# Patient Record
Sex: Female | Born: 1955 | Race: White | Hispanic: No | Marital: Married | Smoking: Former smoker
Health system: Southern US, Community
[De-identification: ages and names within clinical notes are randomized; demographics above are authoritative.]

## PROBLEM LIST (undated history)

## (undated) DIAGNOSIS — G610 Guillain-Barre syndrome: Secondary | ICD-10-CM

## (undated) DIAGNOSIS — C801 Malignant (primary) neoplasm, unspecified: Secondary | ICD-10-CM

## (undated) HISTORY — DX: Guillain-Barre syndrome: G61.0

---

## 1999-05-18 ENCOUNTER — Other Ambulatory Visit: Admission: RE | Admit: 1999-05-18 | Discharge: 1999-05-18 | Payer: Self-pay | Admitting: Obstetrics & Gynecology

## 1999-05-25 ENCOUNTER — Encounter: Payer: Self-pay | Admitting: Obstetrics & Gynecology

## 1999-05-25 ENCOUNTER — Encounter: Admission: RE | Admit: 1999-05-25 | Discharge: 1999-05-25 | Payer: Self-pay | Admitting: Obstetrics & Gynecology

## 1999-07-22 ENCOUNTER — Ambulatory Visit (HOSPITAL_BASED_OUTPATIENT_CLINIC_OR_DEPARTMENT_OTHER): Admission: RE | Admit: 1999-07-22 | Discharge: 1999-07-22 | Payer: Self-pay | Admitting: Surgery

## 1999-08-17 ENCOUNTER — Encounter: Payer: Self-pay | Admitting: Surgery

## 1999-08-19 ENCOUNTER — Ambulatory Visit (HOSPITAL_COMMUNITY): Admission: RE | Admit: 1999-08-19 | Discharge: 1999-08-19 | Payer: Self-pay | Admitting: Surgery

## 1999-08-19 ENCOUNTER — Encounter: Payer: Self-pay | Admitting: Surgery

## 1999-08-29 ENCOUNTER — Encounter: Payer: Self-pay | Admitting: Surgery

## 1999-08-29 ENCOUNTER — Encounter: Admission: RE | Admit: 1999-08-29 | Discharge: 1999-08-29 | Payer: Self-pay | Admitting: Surgery

## 1999-09-06 ENCOUNTER — Encounter: Admission: RE | Admit: 1999-09-06 | Discharge: 1999-12-05 | Payer: Self-pay | Admitting: Radiation Oncology

## 2000-02-21 HISTORY — PX: BREAST LUMPECTOMY: SHX2

## 2010-03-22 ENCOUNTER — Encounter (INDEPENDENT_AMBULATORY_CARE_PROVIDER_SITE_OTHER): Payer: Self-pay | Admitting: *Deleted

## 2010-03-30 NOTE — Letter (Signed)
Summary: Pre Visit Letter Revised  [facility]  [address] [place], [place] [address]   Phone: [phone]  Fax: [phone]        03/22/2010 MRN: [id number] [facility] [place] [address] [place], [place]  [address]             Procedure Date:  04/18/2010 @ 1:30   Direct Colon-Dr. Arlyce Dice   Welcome to the Gastroenterology Division at [facility].    You are scheduled to see a nurse for your pre-procedure visit on 04/04/2010 at 10:00 on the 3rd floor at [facility], [address]  We ask that you try to arrive at our office 15 minutes prior to your appointment time to allow for check-in.  Please take a minute to review the attached form.  If you answer "Yes" to one or more of the questions on the first page, we ask that you call the person listed at your earliest opportunity.  If you answer "No" to all of the questions, please complete the rest of the form and bring it to your appointment.    Your nurse visit will consist of discussing your medical and surgical history, your immediate family medical history, and your medications.   If you are unable to list all of your medications on the form, please bring the medication bottles to your appointment and we will list them.  We will need to be aware of both prescribed and over the counter drugs.  We will need to know exact dosage information as well.    Please be prepared to read and sign documents such as consent forms, a financial agreement, and acknowledgement forms.  If necessary, and with your consent, a friend or relative is welcome to sit-in on the nurse visit with you.  Please bring your insurance card so that we may make a copy of it.  If your insurance requires a referral to see a specialist, please bring your referral form from your primary care physician.  No co-pay is required for this nurse visit.     If you cannot keep your appointment, please call [phone] to cancel or reschedule prior to your  appointment date.  This allows [place] the opportunity to schedule an appointment for another patient in need of care.    Thank you for choosing [facility] for your medical needs.  We appreciate the opportunity to care for you.  Please visit [place] at our website  to learn more about our practice.  Sincerely, The Gastroenterology Division

## 2010-04-18 ENCOUNTER — Other Ambulatory Visit: Payer: Self-pay | Admitting: Gastroenterology

## 2015-09-14 ENCOUNTER — Encounter: Payer: Self-pay | Admitting: Nurse Practitioner

## 2015-09-20 ENCOUNTER — Other Ambulatory Visit: Payer: Self-pay | Admitting: Radiology

## 2015-09-23 ENCOUNTER — Telehealth: Payer: Self-pay | Admitting: *Deleted

## 2015-09-23 DIAGNOSIS — C50412 Malignant neoplasm of upper-outer quadrant of left female breast: Secondary | ICD-10-CM | POA: Insufficient documentation

## 2015-09-23 NOTE — Telephone Encounter (Signed)
Left message for a return phone call to schedule for [facility]. 

## 2015-09-23 NOTE — Telephone Encounter (Signed)
Received call back from patient.Confirmed [facility] for 09/29/15 at 815am .  Instructions and contact information given.

## 2015-09-29 ENCOUNTER — Encounter: Payer: Self-pay | Admitting: *Deleted

## 2015-09-29 ENCOUNTER — Other Ambulatory Visit (HOSPITAL_BASED_OUTPATIENT_CLINIC_OR_DEPARTMENT_OTHER): Payer: BLUE CROSS/BLUE SHIELD

## 2015-09-29 ENCOUNTER — Encounter: Payer: Self-pay | Admitting: Hematology and Oncology

## 2015-09-29 ENCOUNTER — Ambulatory Visit (HOSPITAL_BASED_OUTPATIENT_CLINIC_OR_DEPARTMENT_OTHER): Payer: BLUE CROSS/BLUE SHIELD | Admitting: Hematology and Oncology

## 2015-09-29 ENCOUNTER — Ambulatory Visit
Admission: RE | Admit: 2015-09-29 | Discharge: 2015-09-29 | Disposition: A | Discharge status: Home / Self Care | Payer: BLUE CROSS/BLUE SHIELD | Source: Ambulatory Visit | Attending: Radiation Oncology | Admitting: Radiation Oncology

## 2015-09-29 ENCOUNTER — Encounter: Payer: Self-pay | Admitting: Radiation Oncology

## 2015-09-29 ENCOUNTER — Ambulatory Visit: Payer: BLUE CROSS/BLUE SHIELD | Attending: Surgery | Admitting: Physical Therapy

## 2015-09-29 ENCOUNTER — Encounter: Payer: Self-pay | Admitting: Skilled Nursing Facility1

## 2015-09-29 ENCOUNTER — Other Ambulatory Visit: Payer: BLUE CROSS/BLUE SHIELD

## 2015-09-29 ENCOUNTER — Ambulatory Visit: Payer: Self-pay | Admitting: Surgery

## 2015-09-29 VITALS — BP 133/73 | HR 85 | Temp 98.5°F | Resp 18 | Ht 69.0 in | Wt 104.4 lb

## 2015-09-29 DIAGNOSIS — Z171 Estrogen receptor negative status [ER-]: Secondary | ICD-10-CM | POA: Diagnosis not present

## 2015-09-29 DIAGNOSIS — R293 Abnormal posture: Secondary | ICD-10-CM | POA: Diagnosis present

## 2015-09-29 DIAGNOSIS — C50412 Malignant neoplasm of upper-outer quadrant of left female breast: Secondary | ICD-10-CM | POA: Diagnosis not present

## 2015-09-29 LAB — COMPREHENSIVE METABOLIC PANEL
ALT: 17 U/L (ref 0–55)
AST: 19 U/L (ref 5–34)
Albumin: 4 g/dL (ref 3.5–5.0)
Alkaline Phosphatase: 103 U/L (ref 40–150)
Anion Gap: 8 mEq/L (ref 3–11)
BUN: 13.6 mg/dL (ref 7.0–26.0)
CO2: 26 mEq/L (ref 22–29)
Calcium: 9.6 mg/dL (ref 8.4–10.4)
Chloride: 105 mEq/L (ref 98–109)
Creatinine: 0.7 mg/dL (ref 0.6–1.1)
EGFR: 88 mL/min/{1.73_m2} — ABNORMAL LOW (ref >90)
Glucose: 91 mg/dl (ref 70–140)
Potassium: 4.2 mEq/L (ref 3.5–5.1)
Sodium: 139 mEq/L (ref 136–145)
Total Bilirubin: 0.43 mg/dL (ref 0.20–1.20)
Total Protein: 6.9 g/dL (ref 6.4–8.3)

## 2015-09-29 LAB — CBC WITH DIFFERENTIAL/PLATELET
BASO%: 1.1 % (ref 0.0–2.0)
Basophils Absolute: 0.1 10*3/uL (ref 0.0–0.1)
EOS%: 1.6 % (ref 0.0–7.0)
Eosinophils Absolute: 0.1 10*3/uL (ref 0.0–0.5)
HCT: 44 % (ref 34.8–46.6)
HGB: 15 g/dL (ref 11.6–15.9)
LYMPH%: 25.2 % (ref 14.0–49.7)
MCH: 33.2 pg (ref 25.1–34.0)
MCHC: 34 g/dL (ref 31.5–36.0)
MCV: 97.5 fL (ref 79.5–101.0)
MONO#: 0.9 10*3/uL (ref 0.1–0.9)
MONO%: 10.2 % (ref 0.0–14.0)
NEUT#: 5.2 10*3/uL (ref 1.5–6.5)
NEUT%: 61.9 % (ref 38.4–76.8)
Platelets: 319 10*3/uL (ref 145–400)
RBC: 4.51 10*6/uL (ref 3.70–5.45)
RDW: 12.5 % (ref 11.2–14.5)
WBC: 8.4 10*3/uL (ref 3.9–10.3)
lymph#: 2.1 10*3/uL (ref 0.9–3.3)

## 2015-09-29 NOTE — Assessment & Plan Note (Addendum)
09/20/2015: Left breast mass 3 cm 1:00 position: IDC grade 2, ER/PR negative, Ki-67 60%, HER-2 negative, T2 N0 stage IIA clinical stage Left lumpectomy 2002: Stage I breast cancer that was ER/PR negative followed by adjuvant radiation  Pathology and radiology counseling: Discussed with the patient, the details of pathology including the type of breast cancer,the clinical staging, the significance of ER, PR and HER-2/neu receptors and the implications for treatment. After reviewing the pathology in detail, we proceeded to discuss the different treatment options between surgery, radiation, chemotherapy, antiestrogen therapies.  Recommendation: 1. Left mastectomy with lymph node dissection 2. Followed by adjuvant chemotherapy with dose dense Adriamycin and Cytoxan every 2 weeks 4 followed by weekly Taxol 12 No role of adjuvant radiation if the lymph nodes and margins are negative. No role for antiestrogen therapy since she is triple negative.  Chemotherapy Counseling: I discussed the risks and benefits of chemotherapy including the risks of nausea/ vomiting, risk of infection from low WBC count, fatigue due to chemo or anemia, bruising or bleeding due to low platelets, mouth sores, loss/ change in taste and decreased appetite. Liver and kidney function will be monitored through out chemotherapy as abnormalities in liver and kidney function may be a side effect of treatment. Cardiac dysfunction due to Adriamycin was discussed in detail. Risk of permanent bone marrow dysfunction and leukemia due to chemo were also discussed.  Return to clinic after surgery to discuss the final pathology report and decide on the start of adjuvant chemotherapy.

## 2015-09-29 NOTE — Patient Instructions (Signed)

## 2015-09-29 NOTE — Progress Notes (Signed)
For the patient to understand and be given the tools to implement a healthy plant based diet during their cancer diagnosis.   Patient was seen today and accompanied by her husband. Pts ht 69 in, 104 pounds, BMI 15.4. Pt was visibly uncomfortable/obstinant when discussing food and continueally cut looks to her husband. Pts husband was reading a book. Pt states her usual wt as an adult is 110 pounds. When asked if she has lost any wt pt looked down and said not really and then said she is usually 110 pounds with body language identifying her as not having an issue with that wt loss. Upon visualization the pt has a wasted appearance with ribs seen throughout her chest and degrading teeth as well as thinning hair. There is a hx of alcohol abuse in her chart.    Dietitian educated the patient on implementing a plant based diet by incorporating more plant proteins, fruits, and vegetables. As a part of a healthy routine physical activity was discussed. Dietitian attempted to explain the importance of proper nutrient consumption but the pt was not wanting to hear that at this point in time. A folder of evidence based information with a focus on a plant based diet and general nutrition during cancer was given to the patient.  The importance of legitimate, evidence based information was discussed and examples were given. As a part of the continuum of care the cancer dietitian's contact information was given to the patient in the event they would like to have a follow up appointment.

## 2015-09-29 NOTE — Progress Notes (Signed)
Per patient cancel genetic appointment for now.  Appt. Cancelled.

## 2015-09-29 NOTE — Progress Notes (Signed)
Radiation Oncology         [phone] ________________________________  Name: Leah Mcdowell MRN: [id number]  Date: 09/29/2015  DOB: 19-Oct-1955  [id number], Leah Raring, NP  Leah Harvey, NP     REFERRING PHYSICIAN: Berkley Harvey, NP   DIAGNOSIS: The encounter diagnosis was Breast cancer of upper-outer quadrant of left female breast ([place]).   HISTORY OF PRESENT ILLNESS: Leah Mcdowell is a 60 y.o. female with a new diagnosis of left breast cancer. She has a history of invasive breast cancer of the left breast and received a lumpectomy in 2002 followed by radiotherapy by Dr. Danny Mcdowell of the left breast. The patient went for mammography after a palpable lesion was felt which revealed a 3cm mass in July 2017. A diagnostic ultrasound confirmed this mass which measured about 3 cm. Axillary lymph nodes were negative for cortical thickening. 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 She comes today to discuss the role of radiotherapy in her cancer care.   PREVIOUS RADIATION THERAPY: Yes  Approximately 6 weeks of radiotherapy to the left breast with Dr. Danny Mcdowell in 2002   PAST MEDICAL HISTORY:  Past Medical History:  Diagnosis Date  . 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 BREAST LUMPECTOMY Left 2002     FAMILY HISTORY:  Family History  Problem Relation Age of Onset  . Breast cancer Paternal Aunt      SOCIAL HISTORY:  reports that she has been smoking Cigarettes.  She has been smoking about 1.00 pack per day. She does not have any smokeless tobacco history on file. She reports that she drinks alcohol. She reports that she does not use drugs. The patient is married and resides in [place]. 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 She reports smoking 1 ppd of cigarettes. She drinks 2-3 beers per day. She does not use illicit drugs.    ALLERGIES: Review of patient's allergies  indicates no known allergies.   MEDICATIONS:  No current outpatient prescriptions on file.   No current facility-administered medications for this encounter.      REVIEW OF SYSTEMS: On review of systems, the patient reports that she is doing well overall. She denies any chest pain, shortness of breath, cough, fevers, chills, night sweats, unintended weight changes. She denies any bowel or bladder disturbances, and denies abdominal pain, nausea or vomiting. She denies any new musculoskeletal or joint aches or pains. A complete review of systems is obtained and is otherwise negative.     PHYSICAL EXAM:   Pain scale 0/10 In general this is a well appearing caucasian female in no acute distress. She is alert and oriented x4 and appropriate throughout the examination. HEENT reveals that the patient is normocephalic, atraumatic. EOMs are intact. PERRLA. Skin is intact without any evidence of gross lesions. Cardiovascular exam reveals a regular rate and rhythm, no clicks rubs or murmurs are auscultated. Chest is clear to auscultation bilaterally. There is a palpable mass of the left breast in the upper outer quadrant which is about 2.5-3 cm. It is mobile and firm but nontender. No nipple bleeding or discharge is noted. The right breast is intact without bleeding or discharge from the nipple and no palpable mass is noted. Lymphatic assessment is performed and does not reveal any adenopathy in the cervical, supraclavicular, axillary, or inguinal chains. Abdomen has active bowel sounds in all quadrants and is intact. The  abdomen is soft, non tender, non distended. Lower extremities are negative for pretibial pitting edema, deep calf tenderness, cyanosis or clubbing.   ECOG = 0  0 - Asymptomatic (Fully active, able to carry on all predisease activities without restriction)  1 - Symptomatic but completely ambulatory (Restricted in physically strenuous activity but ambulatory and able to carry out work of  a light or sedentary nature. For example, light housework, office work)  2 - Symptomatic, <50% in bed during the day (Ambulatory and capable of all self care but unable to carry out any work activities. Up and about more than 50% of waking hours)  3 - Symptomatic, >50% in bed, but not bedbound (Capable of only limited self-care, confined to bed or chair 50% or more of waking hours)  4 - Bedbound (Completely disabled. Cannot carry on any self-care. Totally confined to bed or chair)  5 - Death   Leah Mcdowell, Creech RH, Tormey DC, et al. [phone]). "Toxicity and response criteria of the [facility]". [place] Oncol. 5 (6): 649-55    LABORATORY DATA:  Lab Results  Component Value Date   WBC 8.4 09/29/2015   HGB 15.0 09/29/2015   HCT 44.0 09/29/2015   MCV 97.5 09/29/2015   PLT 319 09/29/2015   Lab Results  Component Value Date   NA 139 09/29/2015   K 4.2 09/29/2015   CO2 26 09/29/2015   Lab Results  Component Value Date   ALT 17 09/29/2015   AST 19 09/29/2015   ALKPHOS 103 09/29/2015   BILITOT 0.43 09/29/2015      RADIOGRAPHY: No results found.     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   PLAN: Dr. [name] discusses the pathology findings and reviews the nature of invasive disease in the setting of previous breast cancer treated with prior radiotherapy. The consensus from the breast conference includes mastectomy with axillary lymph node dissection, and adjuvant chemotherapy. At this point in time we do not anticipate a role for radiotherapy. Dr. [name] outlines the scenarios for when radiotherapy could be considered. We will be happy to see the patient back if she is a candidate for treatment, or if she has questions regarding treatment.   The above documentation reflects my direct findings during this shared patient visit. Please see the separate note by Dr. [name] on this date for the remainder of  the patient's plan of care.    Carola Rhine, PAC

## 2015-09-29 NOTE — H&P (Signed)
Leah Mcdowell 09/29/2015 8:01 AM Location: [facility] Patient #: [id number] DOB: December 26, 1955 Undefined / Language: Leah Mcdowell / Race: White Female  History of Present Illness [name] [name] MD; 09/29/2015 10:47 AM) Patient words: patient presents at the request of Dr. [name] for left breast cancer. She is a history of a left breast lumpectomy with sentinel lymph node mapping in 2002 for left breast cancer. 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 This was apparently similar to her tumor in 2002. Patient denies any history of breast discomfort bus ago.notice a mass in her left breast a few months ago. There is no nipple discharge.  The patient is a 60 year old female.   Medication History Conni Slipper, RN; 09/29/2015 8:05 AM) No Current Medications Medications Reconciled     Physical Exam ([name] [name] MD; 09/29/2015 10:48 AM)  General Mental Status-Alert. General Appearance-Consistent with stated age. Hydration-Well hydrated. Voice-Normal.  Head and Neck Head-normocephalic, atraumatic with no lesions or palpable masses. Trachea-midline. Thyroid Gland Characteristics - normal size and consistency.  Eye Eyeball - Bilateral-Extraocular movements intact. Sclera/Conjunctiva - Bilateral-No scleral icterus.  Chest and Lung Exam Chest and lung exam reveals -quiet, even and easy respiratory effort with no use of accessory muscles and on auscultation, normal breath sounds, no adventitious sounds and normal vocal resonance. Inspection Chest Wall - Normal. Back - normal.  Breast Note: left pressures a 3 cm mobile mass upper outer quadrant. Previous left breast lumpectomy scar noted. Left axilla scar noted. Mild nondescript left axillary adenopathy. Right breast normal. Cup size B no nipple discharge or retraction.  Cardiovascular Cardiovascular  examination reveals -normal heart sounds, regular rate and rhythm with no murmurs and normal pedal pulses bilaterally.  Neurologic Neurologic evaluation reveals -alert and oriented x 3 with no impairment of recent or remote memory. Mental Status-Normal.  Musculoskeletal Normal Exam - Left-Upper Extremity Strength Normal and Lower Extremity Strength Normal. Normal Exam - Right-Upper Extremity Strength Normal and Lower Extremity Strength Normal.  Lymphatic Axillary -Note:mild left axillary adenopathy.     Assessment & Plan ([name] A. [name] MD; 09/29/2015 10:59 AM)  BREAST CANCER, LEFT (C50.912) Impression: patient WITH LEFT BREAST CANCER. only option is left simple mastectomy. I discussed reconstruction and she is interested in that. She will need chemotheoday more than likely I discussed that with her today. she will need a port. Risk of bleeding, infection, collapsed lung, mediastinal injury, or catheter migration death. We'll refer to plastic surgery for evaluation for possible reconstruction. Recommend smoking cessation. Discussed treatment options for breast cancer to include breast conservation vs mastectomy with reconstruction. Pt has decided on mastectomy. Risk include bleeding, infection, flap necrosis, pain, numbness, recurrence, hematoma, other surgery needs. Pt understands and agrees to proceed. Risk of lymph node surgery include bleeding, infection, lymphedema, shoulder pain. stiffness, dye allergy. cosmetic deformity , blood clots, death, need for more surgery. Pt agres to proceed.  risk of port pplacement include bleeding, infection, collapsed lung, one vessel injumigration, DVT, catheter malfunction, catheter migration, cardiac injury, mediastinal injury,and the need for other procedures for vascular access.Alternatives discussed  Current Plans You are being scheduled for surgery - Our schedulers will call you.  You should hear from our office's scheduling  department within 5 working days about the location, date, and time of surgery. We try to make accommodations for patient's preferences in scheduling surgery, but sometimes the OR schedule or the surgeon's schedule prevents  [place] from making those accommodations.  If you have not heard from our office [phone]) in 5 working days, call the office and ask for your surgeon's nurse.  If you have other questions about your diagnosis, plan, or surgery, call the office and ask for your surgeon's nurse.  Pt Education - [facility] Breast Cancer Information Given - [facility] "Breast Journey" Package We discussed the staging and pathophysiology of breast cancer. We discussed all of the different options for treatment for breast cancer including surgery, chemotherapy, radiation therapy, Herceptin, and antiestrogen therapy. We discussed a sentinel lymph node biopsy as she does not appear to having lymph node involvement right now. We discussed the performance of that with injection of radioactive tracer and blue dye. We discussed that she would have an incision underneath her axillary hairline. We discussed that there is a bout a 10-20% chance of having a positive node with a sentinel lymph node biopsy and we will await the permanent pathology to make any other first further decisions in terms of her treatment. One of these options might be to return to the operating room to perform an axillary lymph node dissection. We discussed about a 1-2% risk lifetime of chronic shoulder pain as well as lymphedema associated with a sentinel lymph node biopsy. We discussed the options for treatment of the breast cancer which included lumpectomy versus a mastectomy. We discussed the performance of the lumpectomy with a wire placement. We discussed a 10-20% chance of a positive margin requiring reexcision in the operating room. We also discussed that she may need radiation therapy or antiestrogen therapy or both if she undergoes lumpectomy.  We discussed the mastectomy and the postoperative care for that as well. We discussed that there is no difference in her survival whether she undergoes lumpectomy with radiation therapy or antiestrogen therapy versus a mastectomy. There is a slight difference in the local recurrence rate being 3-5% with lumpectomy and about 1% with a mastectomy. We discussed the risks of operation including bleeding, infection, possible reoperation. She understands her further therapy will be based on what her stages at the time of her operation.  Pt Education - [facility] Mastectomy HCI Use of a central venous catheter for intravenous therapy was discussed. Technique of catheter placement using ultrasound and fluoroscopy guidance was discussed. Risks such as bleeding, infection, pneumothorax, catheter occlusion, reoperation, and other risks were discussed. I noted a good likelihood this will help address the problem. Questions were answered. The patient expressed understanding & wishes to proceed.

## 2015-09-29 NOTE — Progress Notes (Signed)
Clinical Social Work [place] Psychosocial Distress Screening [place]  Patient completed distress screening protocol and scored a 5 on the Psychosocial Distress Thermometer which indicates mild distress. Clinical Social Worker met with patient and patients husband in [facility] to assess for distress and other psychosocial needs. Patient stated she was feeling overwhelmed but felt "better" after meeting with the treatment team and getting more information on her treatment plan. CSW and patient discussed common feeling and emotions when being diagnosed with cancer, and the importance of support during treatment. CSW informed patient of the support team and support services at [facility]. CSW provided contact information and encouraged patient to call with any questions or concerns.    ONCBCN DISTRESS SCREENING 09/29/2015  Screening Type Initial Screening  Distress experienced in past week (1-10) 5  Information Concerns Type Lack of info about diagnosis;Lack of info about treatment  Physician notified of physical symptoms Yes  Referral to clinical social work Yes   Johnnye Lana, MSW, LCSW, OSW-C Clinical Social Worker [facility] [phone]

## 2015-09-29 NOTE — Progress Notes (Signed)
Leah Mcdowell CONSULT NOTE  Patient Care Team: Leah Harvey, NP as PCP - General (Nurse Practitioner) Leah Luna, MD as Consulting Physician (General Surgery) Leah Lose, MD as Consulting Physician (Hematology and Oncology) Leah Rudd, MD as Consulting Physician (Radiation Oncology)  CHIEF COMPLAINTS/PURPOSE OF CONSULTATION:  Newly diagnosed breast cancer  HISTORY OF PRESENTING ILLNESS:  Leah Mcdowell 60 y.o. female is here because of recent diagnosis of left breast cancer. She originally had a left lumpectomy for what appears to be a stage I breast cancer triple negative disease that was treated with lumpectomy followed by adjuvant radiation. She was doing quite well up until recently when she underwent a mammogram that revealed a left breast mass. She had actually a palpable lesion in left breast for the past 4-6 months. The mass measured 3 cm by ultrasound and axilla was negative. 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 This apparently is similar to her prior resection. She was presented this morning at the multidisciplinary tumor board and she is here today at the [facility] clinic to discuss a treatment plan.  I reviewed her records extensively and collaborated the history with the patient.  SUMMARY OF ONCOLOGIC HISTORY:   Breast cancer of upper-outer quadrant of left female breast ([place])   09/28/2000 Initial Biopsy    Left lumpectomy for stage I breast cancer that was ER/PR negative followed by adjuvant radiation     09/20/2015 Initial Diagnosis    Left breast mass 3 cm 1:00 position: IDC grade 2, ER/PR negative, Ki-67 60%, HER-2 negative, T2 N0 stage IIA clinical stage     MEDICAL HISTORY:  Past Medical History:  Diagnosis Date  . Guillain-Barre [facility])     SURGICAL HISTORY: Past Surgical History:  Procedure Laterality Date  . BREAST LUMPECTOMY Left 2002    SOCIAL  HISTORY: Social History   Social History  . Marital status: Single    Spouse name: N/A  . Number of children: N/A  . Years of education: N/A   Occupational History  . Not on file.   Social History Main Topics  . Smoking status: Current Every Day Smoker    Packs/day: 1.00    Types: Cigarettes  . Smokeless tobacco: Not on file  . Alcohol use Yes     Comment: moderate use  . Drug use: No  . Sexual activity: Not on file   Other Topics Concern  . Not on file   Social History Narrative  . No narrative on file    FAMILY HISTORY: Family History  Problem Relation Age of Onset  . Breast cancer Paternal Aunt     ALLERGIES:  has No Known Allergies.  MEDICATIONS:  No current outpatient prescriptions on file.   No current facility-administered medications for this visit.     REVIEW OF SYSTEMS:   Constitutional: Denies fevers, chills or abnormal night sweats Eyes: Denies blurriness of vision, double vision or watery eyes Ears, nose, mouth, throat, and face: Denies mucositis or sore throat Respiratory: Denies cough, dyspnea or wheezes Cardiovascular: Denies palpitation, chest discomfort or lower extremity swelling Gastrointestinal:  Denies nausea, heartburn or change in bowel habits Skin: Denies abnormal skin rashes Lymphatics: Denies new lymphadenopathy or easy bruising Neurological:Denies numbness, tingling or new weaknesses Behavioral/Psych: Mood is stable, no new changes  Breast: Palpable lump in the left breast All other systems were reviewed with the patient and are negative.  PHYSICAL EXAMINATION:  ECOG PERFORMANCE STATUS: 1 - Symptomatic but completely ambulatory  Vitals:   09/29/15 0853  BP: 133/73  Pulse: 85  Resp: 18  Temp: 98.5 F (36.9 C)   Filed Weights   09/29/15 0853  Weight: 104 lb 6.4 oz (47.4 kg)    GENERAL:alert, no distress and comfortable SKIN: skin color, texture, turgor are normal, no rashes or significant lesions EYES: normal,  conjunctiva are pink and non-injected, sclera clear OROPHARYNX:no exudate, no erythema and lips, buccal mucosa, and tongue normal  NECK: supple, thyroid normal size, non-tender, without nodularity LYMPH:  no palpable lymphadenopathy in the cervical, axillary or inguinal LUNGS: clear to auscultation and percussion with normal breathing effort HEART: regular rate & rhythm and no murmurs and no lower extremity edema ABDOMEN:abdomen soft, non-tender and normal bowel sounds Musculoskeletal:no cyanosis of digits and no clubbing  PSYCH: alert & oriented x 3 with fluent speech NEURO: no focal motor/sensory deficits BREAST: Left breast lump measuring 3-4 cm in size. No palpable axillary or supraclavicular lymphadenopathy (exam performed in the presence of a chaperone)   LABORATORY DATA:  I have reviewed the data as listed Lab Results  Component Value Date   WBC 8.4 09/29/2015   HGB 15.0 09/29/2015   HCT 44.0 09/29/2015   MCV 97.5 09/29/2015   PLT 319 09/29/2015   Lab Results  Component Value Date   NA 139 09/29/2015   K 4.2 09/29/2015   CO2 26 09/29/2015    RADIOGRAPHIC STUDIES: I have personally reviewed the radiological reports and agreed with the findings in the report.  ASSESSMENT AND PLAN:  Breast cancer of upper-outer quadrant of left female breast ([place]) 09/20/2015: Left breast mass 3 cm 1:00 position: IDC grade 2, ER/PR negative, Ki-67 60%, HER-2 negative, T2 N0 stage IIA clinical stage Left lumpectomy 2002: Stage I breast cancer that was ER/PR negative followed by adjuvant radiation  Pathology and radiology counseling: Discussed with the patient, the details of pathology including the type of breast cancer,the clinical staging, the significance of ER, PR and HER-2/neu receptors and the implications for treatment. After reviewing the pathology in detail, we proceeded to discuss the different treatment options between surgery, radiation, chemotherapy, antiestrogen  therapies.  Recommendation: Patient refused genetic counseling 1. Left mastectomy with lymph node dissection 2. Followed by adjuvant chemotherapy with dose dense Adriamycin and Cytoxan every 2 weeks 4 followed by weekly Taxol 12 No role of adjuvant radiation if the lymph nodes and margins are negative. No role for antiestrogen therapy since she is triple negative.  Chemotherapy Counseling: I discussed the risks and benefits of chemotherapy including the risks of nausea/ vomiting, risk of infection from low WBC count, fatigue due to chemo or anemia, bruising or bleeding due to low platelets, mouth sores, loss/ change in taste and decreased appetite. Liver and kidney function will be monitored through out chemotherapy as abnormalities in liver and kidney function may be a side effect of treatment. Cardiac dysfunction due to Adriamycin was discussed in detail. Risk of permanent bone marrow dysfunction and leukemia due to chemo were also discussed.  Return to clinic after surgery to discuss the final pathology report and decide on the start of adjuvant chemotherapy.     All questions were answered. The patient knows to call the clinic with any problems, questions or concerns.    Rulon Eisenmenger, MD 09/29/15

## 2015-09-29 NOTE — Therapy (Signed)
[place], [place], [address] Phone: [phone]   Fax:  [phone]  Physical Therapy Evaluation  Patient Details  Name: Leah Mcdowell MRN: [id number] Date of Birth: 1956/01/04 Referring Provider: Dr. Erroll Luna  Encounter Date: 09/29/2015      PT End of Session - 09/29/15 1144    Visit Number 1   Number of Visits 1   PT Start Time 0354   PT Stop Time 1057   PT Time Calculation (min) 25 min   Activity Tolerance Patient tolerated treatment well   Behavior During Therapy [facility] for tasks assessed/performed      Past Medical History:  Diagnosis Date  . Guillain-Barre [facility])     Past Surgical History:  Procedure Laterality Date  . BREAST LUMPECTOMY Left 2002    There were no vitals filed for this visit.       Subjective Assessment - 09/29/15 1145    Subjective Patient reports she is here today to be seen by her medical team for her newly diagnosed left breast cancer.   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  It measures 3 cm and is located in the upper outer quadrant.  Her Ki67 is 60%.  She had a previous left lumpectomy and sentinel node biopsy in 2002 with radiation.  She also has a history of [place].   Patient Stated Goals Reduce lymphedema risk and learn post op shoulder ROM HEP.            [facility] PT Assessment - 09/29/15 0001      Assessment   Medical Diagnosis Left breast cancer   Referring Provider Dr. Marcello Moores [place]   Onset Date/Surgical Date 09/14/15   Hand Dominance Right   Prior Therapy none     Precautions   Precautions Other (comment)   Precaution Comments Active breast cancer and hx left lumpectomy and radiation 2002; left UE lymphedema risk     Restrictions   Weight Bearing Restrictions No     Balance Screen   Has the patient  fallen in the past 6 months No   Has the patient had a decrease in activity level because of a fear of falling?  No   Is the patient reluctant to leave their home because of a fear of falling?  No     Home Environment   Living Environment Private residence   Living Arrangements Spouse/significant other   Available Help at Discharge Family     Prior Function   Level of Independence Independent   Vocation Full time employment   Vocation Requirements customer service   Leisure She does not exericse     Cognition   Overall Cognitive Status Within Functional Limits for tasks assessed     Posture/Postural Control   Posture/Postural Control Postural limitations   Postural Limitations Rounded Shoulders;Forward head   Posture Comments Appear to have some muscle wasting and appears underweight     ROM / Strength   AROM / PROM / Strength AROM;Strength     AROM   AROM Assessment Site Shoulder;Cervical   Right/Left Shoulder Right;Left   Right Shoulder Extension 55 Degrees   Right Shoulder Flexion 154 Degrees   Right Shoulder ABduction 162 Degrees   Right Shoulder Internal Rotation 73 Degrees   Right Shoulder External Rotation 90 Degrees   Left Shoulder Extension 66 Degrees  Left Shoulder Flexion 155 Degrees   Left Shoulder ABduction 160 Degrees   Left Shoulder Internal Rotation 89 Degrees   Left Shoulder External Rotation 84 Degrees   Cervical Flexion WNL   Cervical Extension WNL   Cervical - Right Side Bend WNL   Cervical - Left Side Bend WNL   Cervical - Right Rotation WNL   Cervical - Left Rotation WNL     Strength   Overall Strength Within functional limits for tasks performed           LYMPHEDEMA/ONCOLOGY QUESTIONNAIRE - 09/29/15 1142      Type   Cancer Type Left breast cancer     Lymphedema Assessments   Lymphedema Assessments Upper extremities     Right Upper Extremity Lymphedema   10 cm Proximal to Olecranon Process 19.8 cm   Olecranon Process 21.3 cm   10  cm Proximal to Ulnar Styloid Process 17.9 cm   Just Proximal to Ulnar Styloid Process 14.1 cm   Across Hand at [employer] 17.5 cm   At [place] of 2nd Digit 5.8 cm     Left Upper Extremity Lymphedema   10 cm Proximal to Olecranon Process 19.5 cm   Olecranon Process 20.6 cm   10 cm Proximal to Ulnar Styloid Process 16.3 cm   Just Proximal to Ulnar Styloid Process 13.5 cm   Across Hand at [employer] 16.1 cm   At [place] of 2nd Digit 5.6 cm      Patient was instructed today in a home exercise program today for post op shoulder range of motion. These included active assist shoulder flexion in sitting, scapular retraction, wall walking with shoulder abduction, and hands behind head external rotation.  She was encouraged to do these twice a day, holding 3 seconds and repeating 5 times when permitted by her physician.         PT Education - 09/29/15 1144    Education provided Yes   Education Details Lymphedema risk reduction and post op shoulder ROM HEP   Person(s) Educated Patient;Spouse   Methods Explanation;Demonstration;Handout   Comprehension Returned demonstration;Verbalized understanding              Breast Clinic Goals - 09/29/15 1150      Patient will be able to verbalize understanding of pertinent lymphedema risk reduction practices relevant to her diagnosis specifically related to skin care.   Time 1   Period Days   Status Achieved     Patient will be able to return demonstrate and/or verbalize understanding of the post-op home exercise program related to regaining shoulder range of motion.   Time 1   Period Days   Status Achieved     Patient will be able to verbalize understanding of the importance of attending the postoperative After Breast Cancer Class for further lymphedema risk reduction education and therapeutic exercise.   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  It measures 3 cm and is located in the upper outer quadrant.  Her Ki67 is 60%.  She had a previous left lumpectomy and sentinel node biopsy in 2002 with radiation.  She also has a history of [place].  Her multidisciplinary medical team met prior to her assessment to determine a recommended  treatment plan.  She is planning to have a left mastectomy and axillary lymph node dissection followed by chemotherapy.  She will benefit from post op PT to regain shoulder ROM and reduce lymphedema risk.  Her previous history of left breast cancer complicates her evaluation some with discussing lymphedema risk.  Due to comorbidity of previous breast cancer and her history of Guillain Barre, her eval is of moderate complexity.   Rehab Potential Good   Clinical Impairments Affecting Rehab Potential Previous breast and axillary surgery 2002   PT Frequency One time visit   PT Treatment/Interventions Patient/family education;Therapeutic exercise   PT Next Visit Plan Will f/u after surgery to determine needs   PT Home Exercise Plan Post op shoulder ROM HEP and lymphedema risk reduction   Consulted and Agree with Plan of Care Patient;Family member/caregiver   Family Member Consulted Husband      Patient will benefit from skilled therapeutic intervention in order to improve the following deficits and impairments:  Postural dysfunction, Decreased knowledge of precautions, Pain, Impaired UE functional use, Decreased range of motion  Visit Diagnosis: Carcinoma of upper-outer quadrant of female breast, left ([place]) - Plan: PT plan of care cert/re-cert  Abnormal posture - Plan: PT plan of care cert/re-cert   Patient will follow up at outpatient cancer rehab if needed following surgery.  If the patient requires physical therapy at that time, a specific plan will be dictated and sent to the referring physician for  approval. The patient was educated today on appropriate basic range of motion exercises to begin post operatively and the importance of attending the After Breast Cancer class following surgery.  Patient was educated today on lymphedema risk reduction practices as it pertains to recommendations that will benefit the patient immediately following surgery.  She verbalized good understanding.  No additional physical therapy is indicated at this time.      Problem List Patient Active Problem List   Diagnosis Date Noted  . Breast cancer of upper-outer quadrant of left female breast ([place]) 09/23/2015    Annia Friendly, PT 09/29/15 11:52 AM  [place], [place], [address] Phone: [phone]   Fax:  [phone]  Name: Leah Mcdowell MRN: [id number] Date of Birth: 1955-10-23

## 2015-09-30 ENCOUNTER — Telehealth (HOSPITAL_COMMUNITY): Payer: Self-pay | Admitting: Vascular Surgery

## 2015-09-30 NOTE — Telephone Encounter (Signed)
Left message w/ PT husband to give a call back

## 2015-10-01 ENCOUNTER — Telehealth: Payer: Self-pay | Admitting: Hematology and Oncology

## 2015-10-01 NOTE — Telephone Encounter (Signed)
lvm to Inform pt of 9/8 chemo class at 930 am per los

## 2015-10-04 ENCOUNTER — Telehealth: Payer: Self-pay | Admitting: *Deleted

## 2015-10-04 NOTE — Telephone Encounter (Signed)
Left message for a return phone call to follow up from [facility] 09/29/15.

## 2015-10-05 ENCOUNTER — Ambulatory Visit: Payer: Self-pay | Admitting: Surgery

## 2015-10-05 ENCOUNTER — Other Ambulatory Visit: Payer: Self-pay

## 2015-10-05 ENCOUNTER — Encounter: Payer: Self-pay | Admitting: Genetic Counselor

## 2015-10-12 ENCOUNTER — Telehealth: Payer: Self-pay | Admitting: Hematology and Oncology

## 2015-10-12 NOTE — Telephone Encounter (Signed)
Spoke with pt husband to confirm 9/14 appts per LOS

## 2015-10-14 ENCOUNTER — Encounter (HOSPITAL_BASED_OUTPATIENT_CLINIC_OR_DEPARTMENT_OTHER): Payer: Self-pay | Admitting: *Deleted

## 2015-10-15 ENCOUNTER — Encounter (HOSPITAL_COMMUNITY): Payer: Self-pay

## 2015-10-15 ENCOUNTER — Ambulatory Visit (HOSPITAL_COMMUNITY)
Admission: RE | Admit: 2015-10-15 | Discharge: 2015-10-15 | Disposition: A | Discharge status: Home / Self Care | Payer: BLUE CROSS/BLUE SHIELD | Source: Ambulatory Visit | Attending: Nurse Practitioner | Admitting: Nurse Practitioner

## 2015-10-15 ENCOUNTER — Ambulatory Visit (HOSPITAL_BASED_OUTPATIENT_CLINIC_OR_DEPARTMENT_OTHER)
Admission: RE | Admit: 2015-10-15 | Discharge: 2015-10-15 | Disposition: A | Discharge status: Home / Self Care | Payer: BLUE CROSS/BLUE SHIELD | Source: Ambulatory Visit | Attending: Cardiology | Admitting: Cardiology

## 2015-10-15 VITALS — BP 143/79 | HR 88 | Ht 69.0 in | Wt 105.4 lb

## 2015-10-15 DIAGNOSIS — C50412 Malignant neoplasm of upper-outer quadrant of left female breast: Secondary | ICD-10-CM | POA: Insufficient documentation

## 2015-10-15 DIAGNOSIS — Z87891 Personal history of nicotine dependence: Secondary | ICD-10-CM | POA: Diagnosis not present

## 2015-10-15 DIAGNOSIS — Z09 Encounter for follow-up examination after completed treatment for conditions other than malignant neoplasm: Secondary | ICD-10-CM | POA: Diagnosis present

## 2015-10-15 NOTE — Patient Instructions (Signed)
Your physician recommends that you schedule a follow-up appointment in: Oct with echocardiogram

## 2015-10-15 NOTE — Progress Notes (Signed)
  Echocardiogram 2D Echocardiogram has been performed.  Jennette Dubin 10/15/2015, 2:45 PM

## 2015-10-17 NOTE — Progress Notes (Signed)
Oncology: Dr. Lindi Adie  60 yo with history of breast cancer presents for cardio-oncology evaluation.  Left breast cancer first found 8/02 and treated with lumpectomy and radiation.  She was found to have a left breast recurrence in 7/17, ER-/PR-/HER2-.  She will have left mastectomy in 9/17.  She will get Adriamycin/cytoxin after surgery q2 wks x 4 cycles then weekly Taxol.  She has no known cardiac problems.  She is a smoker but is trying to quit.  No exertional dyspnea or chest pain.  PMH: 1. H/o Guillain-Barre syndrome.  2. Active smoker 3. Breast cancer: Left breast cancer first found 8/02 and treated with lumpectomy and radiation.  She was found to have a left breast recurrence in 7/17, ER-/PR-/HER2-.  She will have left mastectomy in 9/17.  She will get Adriamycin/cytoxin after surgery q2 wks x 4 cycles then weekly Taxol. - Echo (8/17): EF 60-65%, grade II diastolic dysfunction, GLS -21.1%.   Social History   Social History  . Marital status: Married    Spouse name: N/A  . Number of children: N/A  . Years of education: N/A   Occupational History  . Not on file.   Social History Main Topics  . Smoking status: Former Smoker    Packs/day: 0.00    Types: Cigarettes    Quit date: 09/21/2015  . Smokeless tobacco: Never Used  . Alcohol use Yes     Comment: moderate use  . Drug use: No  . Sexual activity: Not on file   Other Topics Concern  . Not on file   Social History Narrative  . No narrative on file   Family History  Problem Relation Age of Onset  . Breast cancer Paternal Aunt    ROS: All systems reviewed and negative except as per HPI.   No current outpatient prescriptions on file.   No current facility-administered medications for this encounter.    BP (!) 143/79 (BP Location: Left Arm, Patient Position: Sitting, Cuff Size: Normal)   Pulse 88   Ht 5' 9"  (1.753 m)   Wt 105 lb 6.4 oz (47.8 kg)   BMI 15.56 kg/m  General: NAD Neck: No JVD, no thyromegaly or thyroid  nodule.  Lungs: Clear to auscultation bilaterally with normal respiratory effort. CV: Nondisplaced PMI.  Heart regular S1/S2, no S3/S4, no murmur.  No peripheral edema.  No carotid bruit.  Normal pedal pulses.  Abdomen: Soft, nontender, no hepatosplenomegaly, no distention.  Skin: Intact without lesions or rashes.  Neurologic: Alert and oriented x 3.  Psych: Normal affect. Extremities: No clubbing or cyanosis.  HEENT: Normal.   Assessment/Plan: Breast cancer: She will be getting Adriamycin for about 8 weeks starting likely in late September.  I reviewed today's echo, her EF is normal and strain pattern is normal.  I will repeat an echo during Adriamycin therapy, likely in late 10/17. I will repeat an echo 6 months after that to try to pick up any late effects from the Adriamycin.  Loralie Champagne 10/17/2015

## 2015-10-27 ENCOUNTER — Encounter (HOSPITAL_BASED_OUTPATIENT_CLINIC_OR_DEPARTMENT_OTHER): Payer: Self-pay | Admitting: *Deleted

## 2015-10-27 NOTE — Anesthesia Preprocedure Evaluation (Signed)
Anesthesia Evaluation  Patient identified by MRN, date of birth, ID band Patient awake    Reviewed: Allergy & Precautions, NPO status , Patient's Chart, lab work & pertinent test results  Airway Mallampati: II  TM Distance: >3 FB Neck ROM: Full    Dental no notable dental hx.    Pulmonary neg pulmonary ROS, former smoker,    Pulmonary exam normal breath sounds clear to auscultation       Cardiovascular negative cardio ROS Normal cardiovascular exam Rhythm:Regular Rate:Normal     Neuro/Psych  Neuromuscular disease negative psych ROS   GI/Hepatic negative GI ROS, Neg liver ROS,   Endo/Other  negative endocrine ROS  Renal/GU negative Renal ROS     Musculoskeletal negative musculoskeletal ROS (+)   Abdominal   Peds  Hematology negative hematology ROS (+)   Anesthesia Other Findings   Reproductive/Obstetrics negative OB ROS                             Anesthesia Physical Anesthesia Plan  ASA: II  Anesthesia Plan: General and Regional   Post-op Pain Management:    Induction: Intravenous  Airway Management Planned: LMA  Additional Equipment:   Intra-op Plan:   Post-operative Plan: Extubation in OR  Informed Consent: I have reviewed the patients History and Physical, chart, labs and discussed the procedure including the risks, benefits and alternatives for the proposed anesthesia with the patient or authorized representative who has indicated his/her understanding and acceptance.   Dental advisory given  Plan Discussed with: CRNA  Anesthesia Plan Comments:         Anesthesia Quick Evaluation

## 2015-10-28 ENCOUNTER — Ambulatory Visit (HOSPITAL_COMMUNITY): Payer: BLUE CROSS/BLUE SHIELD | Admitting: Anesthesiology

## 2015-10-28 ENCOUNTER — Observation Stay (HOSPITAL_BASED_OUTPATIENT_CLINIC_OR_DEPARTMENT_OTHER)
Admission: RE | Admit: 2015-10-28 | Discharge: 2015-10-29 | Disposition: A | Discharge status: Home / Self Care | Payer: BLUE CROSS/BLUE SHIELD | Source: Ambulatory Visit | Attending: Surgery | Admitting: Surgery

## 2015-10-28 ENCOUNTER — Observation Stay (HOSPITAL_COMMUNITY): Payer: BLUE CROSS/BLUE SHIELD

## 2015-10-28 ENCOUNTER — Encounter (HOSPITAL_COMMUNITY)
Admission: RE | Disposition: A | Discharge status: Home / Self Care | Payer: Self-pay | Source: Ambulatory Visit | Attending: Surgery

## 2015-10-28 ENCOUNTER — Ambulatory Visit (HOSPITAL_COMMUNITY): Payer: BLUE CROSS/BLUE SHIELD

## 2015-10-28 ENCOUNTER — Encounter (HOSPITAL_COMMUNITY): Payer: Self-pay | Admitting: Certified Registered Nurse Anesthetist

## 2015-10-28 DIAGNOSIS — C50412 Malignant neoplasm of upper-outer quadrant of left female breast: Secondary | ICD-10-CM | POA: Diagnosis not present

## 2015-10-28 DIAGNOSIS — Z79899 Other long term (current) drug therapy: Secondary | ICD-10-CM | POA: Diagnosis not present

## 2015-10-28 DIAGNOSIS — Z171 Estrogen receptor negative status [ER-]: Secondary | ICD-10-CM | POA: Insufficient documentation

## 2015-10-28 DIAGNOSIS — Z95828 Presence of other vascular implants and grafts: Secondary | ICD-10-CM

## 2015-10-28 DIAGNOSIS — Z853 Personal history of malignant neoplasm of breast: Secondary | ICD-10-CM | POA: Diagnosis not present

## 2015-10-28 DIAGNOSIS — Z87891 Personal history of nicotine dependence: Secondary | ICD-10-CM | POA: Insufficient documentation

## 2015-10-28 DIAGNOSIS — C50919 Malignant neoplasm of unspecified site of unspecified female breast: Secondary | ICD-10-CM

## 2015-10-28 HISTORY — PX: PORTACATH PLACEMENT: SHX2246

## 2015-10-28 HISTORY — DX: Malignant (primary) neoplasm, unspecified: C80.1

## 2015-10-28 HISTORY — PX: SIMPLE MASTECTOMY WITH AXILLARY SENTINEL NODE BIOPSY: SHX6098

## 2015-10-28 HISTORY — PX: MASTECTOMY WITH AXILLARY LYMPH NODE DISSECTION: SHX5661

## 2015-10-28 LAB — BASIC METABOLIC PANEL
ANION GAP: 8 (ref 5–15)
BUN: 14 mg/dL (ref 6–20)
CALCIUM: 9.2 mg/dL (ref 8.9–10.3)
CO2: 26 mmol/L (ref 22–32)
CREATININE: 0.62 mg/dL (ref 0.44–1.00)
Chloride: 104 mmol/L (ref 101–111)
GFR calc Af Amer: >60 mL/min (ref >60)
GLUCOSE: 91 mg/dL (ref 65–99)
Potassium: 3.9 mmol/L (ref 3.5–5.1)
Sodium: 138 mmol/L (ref 135–145)

## 2015-10-28 LAB — CBC
HCT: 41.4 % (ref 36.0–46.0)
HCT: 39.3 % (ref 36.0–46.0)
Hemoglobin: 13.8 g/dL (ref 12.0–15.0)
Hemoglobin: 12.9 g/dL (ref 12.0–15.0)
MCH: 32.9 pg (ref 26.0–34.0)
MCH: 32.7 pg (ref 26.0–34.0)
MCHC: 33.3 g/dL (ref 30.0–36.0)
MCHC: 32.8 g/dL (ref 30.0–36.0)
MCV: 98.6 fL (ref 78.0–100.0)
MCV: 99.5 fL (ref 78.0–100.0)
PLATELETS: 276 10*3/uL (ref 150–400)
PLATELETS: 261 10*3/uL (ref 150–400)
RBC: 4.2 MIL/uL (ref 3.87–5.11)
RBC: 3.95 MIL/uL (ref 3.87–5.11)
RDW: 12.6 % (ref 11.5–15.5)
RDW: 12.5 % (ref 11.5–15.5)
WBC: 6.2 10*3/uL (ref 4.0–10.5)
WBC: 9.8 10*3/uL (ref 4.0–10.5)

## 2015-10-28 LAB — CREATININE, SERUM
CREATININE: 0.63 mg/dL (ref 0.44–1.00)
GFR calc Af Amer: >60 mL/min (ref >60)
GFR calc non Af Amer: >60 mL/min (ref >60)

## 2015-10-28 SURGERY — SIMPLE MASTECTOMY WITH AXILLARY SENTINEL NODE BIOPSY
Anesthesia: Regional | Site: Chest | Laterality: Right

## 2015-10-28 MED ORDER — CEFAZOLIN SODIUM-DEXTROSE 2-4 GM/100ML-% IV SOLN
2.0000 g | Freq: Three times a day (TID) | INTRAVENOUS | Status: AC
  Administered 2015-10-28: 2 g via INTRAVENOUS
  Filled 2015-10-28: qty 100

## 2015-10-28 MED ORDER — MIDAZOLAM HCL 5 MG/5ML IJ SOLN
INTRAMUSCULAR | Status: DC | PRN
  Administered 2015-10-28: 2 mg via INTRAVENOUS

## 2015-10-28 MED ORDER — GABAPENTIN 300 MG PO CAPS
300.0000 mg | ORAL_CAPSULE | ORAL | Status: AC
  Administered 2015-10-28: 300 mg via ORAL
  Filled 2015-10-28: qty 1

## 2015-10-28 MED ORDER — CEFAZOLIN SODIUM-DEXTROSE 2-4 GM/100ML-% IV SOLN
2.0000 g | INTRAVENOUS | Status: AC
  Administered 2015-10-28: 2 g via INTRAVENOUS
  Filled 2015-10-28: qty 100

## 2015-10-28 MED ORDER — SIMETHICONE 80 MG PO CHEW: 40.0000 mg | CHEWABLE_TABLET | Freq: Four times a day (QID) | ORAL | Status: DC | PRN

## 2015-10-28 MED ORDER — FENTANYL CITRATE (PF) 100 MCG/2ML IJ SOLN
INTRAMUSCULAR | Status: AC
  Filled 2015-10-28: qty 2

## 2015-10-28 MED ORDER — ENOXAPARIN SODIUM 40 MG/0.4ML ~~LOC~~ SOLN: 40.0000 mg | SUBCUTANEOUS | Status: DC

## 2015-10-28 MED ORDER — HEPARIN SOD (PORK) LOCK FLUSH 100 UNIT/ML IV SOLN
INTRAVENOUS | Status: AC
  Filled 2015-10-28: qty 5

## 2015-10-28 MED ORDER — CHLORHEXIDINE GLUCONATE CLOTH 2 % EX PADS
6.0000 | MEDICATED_PAD | Freq: Once | CUTANEOUS | Status: DC
Start: 2015-10-28 — End: 2015-10-28

## 2015-10-28 MED ORDER — KCL IN DEXTROSE-NACL 20-5-0.9 MEQ/L-%-% IV SOLN
INTRAVENOUS | Status: DC
  Administered 2015-10-28 – 2015-10-29 (×2): via INTRAVENOUS
  Filled 2015-10-28 (×3): qty 1000

## 2015-10-28 MED ORDER — ONDANSETRON 4 MG PO TBDP: 4.0000 mg | ORAL_TABLET | Freq: Four times a day (QID) | ORAL | Status: DC | PRN

## 2015-10-28 MED ORDER — DIPHENHYDRAMINE HCL 12.5 MG/5ML PO ELIX: 12.5000 mg | ORAL_SOLUTION | Freq: Four times a day (QID) | ORAL | Status: DC | PRN

## 2015-10-28 MED ORDER — METHOCARBAMOL 500 MG PO TABS
500.0000 mg | ORAL_TABLET | Freq: Four times a day (QID) | ORAL | Status: DC | PRN
  Administered 2015-10-28 – 2015-10-29 (×2): 500 mg via ORAL
  Filled 2015-10-28 (×2): qty 1

## 2015-10-28 MED ORDER — HEPARIN SOD (PORK) LOCK FLUSH 100 UNIT/ML IV SOLN
INTRAVENOUS | Status: DC | PRN
  Administered 2015-10-28: 500 [IU] via INTRAVENOUS

## 2015-10-28 MED ORDER — LIDOCAINE 2% (20 MG/ML) 5 ML SYRINGE
INTRAMUSCULAR | Status: DC | PRN
  Administered 2015-10-28: 50 mg via INTRAVENOUS

## 2015-10-28 MED ORDER — LACTATED RINGERS IV SOLN
INTRAVENOUS | Status: DC | PRN
  Administered 2015-10-28 (×2): via INTRAVENOUS

## 2015-10-28 MED ORDER — PROPOFOL 10 MG/ML IV BOLUS
INTRAVENOUS | Status: DC | PRN
  Administered 2015-10-28: 110 mg via INTRAVENOUS

## 2015-10-28 MED ORDER — MIDAZOLAM HCL 2 MG/2ML IJ SOLN
INTRAMUSCULAR | Status: AC
  Filled 2015-10-28: qty 2

## 2015-10-28 MED ORDER — MEPERIDINE HCL 25 MG/ML IJ SOLN: 6.2500 mg | INTRAMUSCULAR | Status: DC | PRN

## 2015-10-28 MED ORDER — 0.9 % SODIUM CHLORIDE (POUR BTL) OPTIME
TOPICAL | Status: DC | PRN
  Administered 2015-10-28: 1000 mL

## 2015-10-28 MED ORDER — HEPARIN SODIUM (PORCINE) 5000 UNIT/ML IJ SOLN
INTRAMUSCULAR | Status: DC | PRN
  Administered 2015-10-28: 500 mL

## 2015-10-28 MED ORDER — OXYCODONE HCL 5 MG PO TABS
5.0000 mg | ORAL_TABLET | ORAL | Status: DC | PRN
  Administered 2015-10-28 – 2015-10-29 (×2): 10 mg via ORAL
  Filled 2015-10-28 (×2): qty 2

## 2015-10-28 MED ORDER — FENTANYL CITRATE (PF) 100 MCG/2ML IJ SOLN
INTRAMUSCULAR | Status: DC | PRN
  Administered 2015-10-28: 50 ug via INTRAVENOUS
  Administered 2015-10-28 (×6): 25 ug via INTRAVENOUS

## 2015-10-28 MED ORDER — OXYCODONE HCL 5 MG PO TABS: 5.0000 mg | ORAL_TABLET | ORAL | 0 refills | Status: DC | PRN

## 2015-10-28 MED ORDER — CELECOXIB 200 MG PO CAPS
400.0000 mg | ORAL_CAPSULE | ORAL | Status: AC
  Administered 2015-10-28: 200 mg via ORAL
  Filled 2015-10-28: qty 2

## 2015-10-28 MED ORDER — METHOCARBAMOL 500 MG PO TABS: 500.0000 mg | ORAL_TABLET | Freq: Four times a day (QID) | ORAL | 0 refills | Status: DC | PRN

## 2015-10-28 MED ORDER — HYDROMORPHONE HCL 1 MG/ML IJ SOLN: 0.2500 mg | INTRAMUSCULAR | Status: DC | PRN

## 2015-10-28 MED ORDER — EPHEDRINE SULFATE 50 MG/ML IJ SOLN
INTRAMUSCULAR | Status: DC | PRN
  Administered 2015-10-28: 10 mg via INTRAVENOUS
  Administered 2015-10-28: 5 mg via INTRAVENOUS
  Administered 2015-10-28: 10 mg via INTRAVENOUS
  Administered 2015-10-28: 5 mg via INTRAVENOUS
  Administered 2015-10-28: 15 mg via INTRAVENOUS
  Administered 2015-10-28 (×2): 5 mg via INTRAVENOUS

## 2015-10-28 MED ORDER — CHLORHEXIDINE GLUCONATE CLOTH 2 % EX PADS: 6.0000 | MEDICATED_PAD | Freq: Once | CUTANEOUS | Status: DC

## 2015-10-28 MED ORDER — BUPIVACAINE-EPINEPHRINE (PF) 0.25% -1:200000 IJ SOLN
INTRAMUSCULAR | Status: AC
  Filled 2015-10-28: qty 30

## 2015-10-28 MED ORDER — ONDANSETRON HCL 4 MG/2ML IJ SOLN
4.0000 mg | Freq: Four times a day (QID) | INTRAMUSCULAR | Status: DC | PRN
  Filled 2015-10-28: qty 2

## 2015-10-28 MED ORDER — PROMETHAZINE HCL 25 MG/ML IJ SOLN: 6.2500 mg | INTRAMUSCULAR | Status: DC | PRN

## 2015-10-28 MED ORDER — BUPIVACAINE-EPINEPHRINE (PF) 0.5% -1:200000 IJ SOLN
INTRAMUSCULAR | Status: DC | PRN
  Administered 2015-10-28: 30 mL

## 2015-10-28 MED ORDER — ZOLPIDEM TARTRATE 5 MG PO TABS: 5.0000 mg | ORAL_TABLET | Freq: Every evening | ORAL | Status: DC | PRN

## 2015-10-28 MED ORDER — BUPIVACAINE-EPINEPHRINE 0.25% -1:200000 IJ SOLN
INTRAMUSCULAR | Status: DC | PRN
  Administered 2015-10-28: 10 mL

## 2015-10-28 MED ORDER — PROPOFOL 10 MG/ML IV BOLUS
INTRAVENOUS | Status: AC
  Filled 2015-10-28: qty 20

## 2015-10-28 MED ORDER — DEXAMETHASONE SODIUM PHOSPHATE 10 MG/ML IJ SOLN
INTRAMUSCULAR | Status: DC | PRN
  Administered 2015-10-28: 10 mg via INTRAVENOUS

## 2015-10-28 MED ORDER — ONDANSETRON HCL 4 MG/2ML IJ SOLN
INTRAMUSCULAR | Status: DC | PRN
Start: 2015-10-28 — End: 2015-10-28
  Administered 2015-10-28: 4 mg via INTRAVENOUS

## 2015-10-28 MED ORDER — DIPHENHYDRAMINE HCL 50 MG/ML IJ SOLN: 12.5000 mg | Freq: Four times a day (QID) | INTRAMUSCULAR | Status: DC | PRN

## 2015-10-28 MED ORDER — ACETAMINOPHEN 500 MG PO TABS
1000.0000 mg | ORAL_TABLET | ORAL | Status: AC
  Administered 2015-10-28: 1000 mg via ORAL
  Filled 2015-10-28: qty 2

## 2015-10-28 MED ORDER — HYDROMORPHONE HCL 1 MG/ML IJ SOLN
1.0000 mg | INTRAMUSCULAR | Status: DC | PRN
  Administered 2015-10-28 – 2015-10-29 (×3): 1 mg via INTRAVENOUS
  Filled 2015-10-28 (×4): qty 1

## 2015-10-28 SURGICAL SUPPLY — 53 items
APPLIER CLIP 9.375 MED OPEN (MISCELLANEOUS) ×4
BAG DECANTER FOR FLEXI CONT (MISCELLANEOUS) ×4 IMPLANT
BINDER BREAST LRG (GAUZE/BANDAGES/DRESSINGS) ×4 IMPLANT
BINDER BREAST XLRG (GAUZE/BANDAGES/DRESSINGS) IMPLANT
BLADE SURG 11 STRL SS (BLADE) ×4 IMPLANT
CANISTER SUCTION 2500CC (MISCELLANEOUS) ×4 IMPLANT
CHLORAPREP W/TINT 26ML (MISCELLANEOUS) ×8 IMPLANT
CLIP APPLIE 9.375 MED OPEN (MISCELLANEOUS) ×2 IMPLANT
COVER PROBE W GEL 5X96 (DRAPES) ×4 IMPLANT
COVER SURGICAL LIGHT HANDLE (MISCELLANEOUS) ×4 IMPLANT
COVER TRANSDUCER ULTRASND GEL (DRAPE) ×4 IMPLANT
CRADLE DONUT ADULT HEAD (MISCELLANEOUS) ×4 IMPLANT
DRAIN CHANNEL 19F RND (DRAIN) ×4 IMPLANT
DRAPE C-ARM 42X72 X-RAY (DRAPES) ×4 IMPLANT
DRAPE CHEST BREAST 15X10 FENES (DRAPES) ×4 IMPLANT
DRAPE LAPAROSCOPIC ABDOMINAL (DRAPES) ×4 IMPLANT
DRAPE UTILITY XL STRL (DRAPES) ×8 IMPLANT
DRSG PAD ABDOMINAL 8X10 ST (GAUZE/BANDAGES/DRESSINGS) ×4 IMPLANT
ELECT CAUTERY BLADE 6.4 (BLADE) ×4 IMPLANT
ELECT REM PT RETURN 9FT ADLT (ELECTROSURGICAL) ×4
ELECTRODE REM PT RTRN 9FT ADLT (ELECTROSURGICAL) ×2 IMPLANT
EVACUATOR SILICONE 100CC (DRAIN) ×4 IMPLANT
GLOVE BIO SURGEON STRL SZ8 (GLOVE) ×8 IMPLANT
GLOVE BIOGEL PI IND STRL 7.0 (GLOVE) ×2 IMPLANT
GLOVE BIOGEL PI IND STRL 7.5 (GLOVE) ×2 IMPLANT
GLOVE BIOGEL PI IND STRL 8 (GLOVE) ×4 IMPLANT
GLOVE BIOGEL PI INDICATOR 7.0 (GLOVE) ×2
GLOVE BIOGEL PI INDICATOR 7.5 (GLOVE) ×2
GLOVE BIOGEL PI INDICATOR 8 (GLOVE) ×4
GLOVE SURG SS PI 7.5 STRL IVOR (GLOVE) ×4 IMPLANT
GOWN STRL REUS W/ TWL LRG LVL3 (GOWN DISPOSABLE) ×2 IMPLANT
GOWN STRL REUS W/ TWL XL LVL3 (GOWN DISPOSABLE) ×2 IMPLANT
GOWN STRL REUS W/TWL LRG LVL3 (GOWN DISPOSABLE) ×2
GOWN STRL REUS W/TWL XL LVL3 (GOWN DISPOSABLE) ×2
KIT BASIN OR (CUSTOM PROCEDURE TRAY) ×8 IMPLANT
KIT PORT POWER 8FR ISP CVUE (Catheter) ×4 IMPLANT
KIT ROOM TURNOVER OR (KITS) ×4 IMPLANT
LIQUID BAND (GAUZE/BANDAGES/DRESSINGS) ×8 IMPLANT
NEEDLE HYPO 25GX1X1/2 BEV (NEEDLE) ×4 IMPLANT
NS IRRIG 1000ML POUR BTL (IV SOLUTION) ×4 IMPLANT
PACK GENERAL/GYN (CUSTOM PROCEDURE TRAY) ×4 IMPLANT
PAD ARMBOARD 7.5X6 YLW CONV (MISCELLANEOUS) ×8 IMPLANT
PENCIL BUTTON HOLSTER BLD 10FT (ELECTRODE) ×4 IMPLANT
SPECIMEN JAR LARGE (MISCELLANEOUS) ×4 IMPLANT
SUT ETHILON 3 0 FSL (SUTURE) ×4 IMPLANT
SUT MNCRL AB 4-0 PS2 18 (SUTURE) ×8 IMPLANT
SUT PROLENE 2 0 SH 30 (SUTURE) ×4 IMPLANT
SUT VIC AB 3-0 SH 18 (SUTURE) ×8 IMPLANT
SYR 20ML ECCENTRIC (SYRINGE) ×8 IMPLANT
SYR 5ML LUER SLIP (SYRINGE) ×4 IMPLANT
SYR CONTROL 10ML LL (SYRINGE) ×4 IMPLANT
TOWEL OR 17X24 6PK STRL BLUE (TOWEL DISPOSABLE) ×4 IMPLANT
TOWEL OR 17X26 10 PK STRL BLUE (TOWEL DISPOSABLE) ×4 IMPLANT

## 2015-10-28 NOTE — Interval H&P Note (Signed)
History and Physical Interval Note:  10/28/2015 7:15 AM  Leah Mcdowell  has presented today for surgery, with the diagnosis of LEFT BREAST CANCER  The various methods of treatment have been discussed with the patient and family. After consideration of risks, benefits and other options for treatment, the patient has consented to  Procedure(s): LEFT SIMPLE MASTECTOMY WITH AXILLARY LYMPH NODE DISSECTION (Left) INSERTION PORT-A-CATH WITH [place] (N/A) as a surgical intervention .  The patient's history has been reviewed, patient examined, no change in status, stable for surgery.  I have reviewed the patient's chart and labs.  Questions were answered to the patient's satisfaction.     Fredda Clarida A.

## 2015-10-28 NOTE — Progress Notes (Signed)
Pt admitted to 6N25 via bed from PACU.  Pt AAO X 4.  Pt on RA.  Pt has 20 G to rt hand with fluids infusing.  Pt has incision site to lt breast with skin glue, ABD, and breast binder.  Pt has SCDs in place.  Report rcvd from [place], [place].  Pt has no questions.  Will continue to monitor.

## 2015-10-28 NOTE — Anesthesia Postprocedure Evaluation (Signed)
Anesthesia Post Note  Patient: Leah Mcdowell  Procedure(s) Performed: Procedure(s) (LRB): LEFT SIMPLE MASTECTOMY WITH AXILLARY LYMPH NODE DISSECTION (Left) INSERTION PORT-A-CATH WITH ULTRASOUND GUIDANCE (Right)  Patient location during evaluation: PACU Anesthesia Type: General Level of consciousness: sedated and patient cooperative Pain management: pain level controlled Vital Signs Assessment: post-procedure vital signs reviewed and stable Respiratory status: spontaneous breathing Cardiovascular status: stable Anesthetic complications: no    Last Vitals:  Vitals:   10/28/15 0959 10/28/15 1045  BP: 123/63   Pulse: 65   Resp: 10   Temp:  36.8 C    Last Pain:  Vitals:   10/28/15 0558  TempSrc: Oral                 Nolon Nations

## 2015-10-28 NOTE — Transfer of Care (Signed)
Immediate Anesthesia Transfer of Care Note  Patient: Leah Mcdowell  Procedure(s) Performed: Procedure(s): LEFT SIMPLE MASTECTOMY WITH AXILLARY LYMPH NODE DISSECTION (Left) INSERTION PORT-A-CATH WITH ULTRASOUND GUIDANCE (Right)  Patient Location: PACU  Anesthesia Type:General and Regional  Level of Consciousness: awake, alert , patient cooperative and responds to stimulation  Airway & Oxygen Therapy: Patient Spontanous Breathing and Patient connected to nasal cannula oxygen  Post-op Assessment: Report given to RN, Post -op Vital signs reviewed and stable and Patient moving all extremities X 4  Post vital signs: Reviewed and stable  Last Vitals:  Vitals:   10/28/15 0558  BP: 111/65  Pulse: 63  Resp: 18  Temp: 36.4 C    Last Pain:  Vitals:   10/28/15 0558  TempSrc: Oral         Complications: No apparent anesthesia complications

## 2015-10-28 NOTE — H&P (View-Only) (Signed)
Leah Mcdowell 09/29/2015 8:01 AM Location: [facility] Patient #: [id number] DOB: 03-06-1955 Undefined / Language: [name] / Race: White Female  History of Present Illness [name] Sapir Lavey MD; 09/29/2015 10:47 AM) Patient words: patient presents at the request of Dr. [name] for left breast cancer. She is a history of a left breast lumpectomy with sentinel lymph node mapping in 2002 for left breast cancer. 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 This was apparently similar to her tumor in 2002. Patient denies any history of breast discomfort bus ago.notice a mass in her left breast a few months ago. There is no nipple discharge.  The patient is a 60 year old female.   Medication History Conni Slipper, RN; 09/29/2015 8:05 AM) No Current Medications Medications Reconciled     Physical Exam (Sherron Mapp A. Azizi Bally MD; 09/29/2015 10:48 AM)  General Mental Status-Alert. General Appearance-Consistent with stated age. Hydration-Well hydrated. Voice-Normal.  Head and Neck Head-normocephalic, atraumatic with no lesions or palpable masses. Trachea-midline. Thyroid Gland Characteristics - normal size and consistency.  Eye Eyeball - Bilateral-Extraocular movements intact. Sclera/Conjunctiva - Bilateral-No scleral icterus.  Chest and Lung Exam Chest and lung exam reveals -quiet, even and easy respiratory effort with no use of accessory muscles and on auscultation, normal breath sounds, no adventitious sounds and normal vocal resonance. Inspection Chest Wall - Normal. Back - normal.  Breast Note: left pressures a 3 cm mobile mass upper outer quadrant. Previous left breast lumpectomy scar noted. Left axilla scar noted. Mild nondescript left axillary adenopathy. Right breast normal. Cup size B no nipple discharge or retraction.  Cardiovascular Cardiovascular  examination reveals -normal heart sounds, regular rate and rhythm with no murmurs and normal pedal pulses bilaterally.  Neurologic Neurologic evaluation reveals -alert and oriented x 3 with no impairment of recent or remote memory. Mental Status-Normal.  Musculoskeletal Normal Exam - Left-Upper Extremity Strength Normal and Lower Extremity Strength Normal. Normal Exam - Right-Upper Extremity Strength Normal and Lower Extremity Strength Normal.  Lymphatic Axillary -Note:mild left axillary adenopathy.     Assessment & Plan (Renny Remer A. Jaylanie Boschee MD; 09/29/2015 10:59 AM)  BREAST CANCER, LEFT (C50.912) Impression: patient WITH LEFT BREAST CANCER. only option is left simple mastectomy. I discussed reconstruction and she is interested in that. She will need chemotheoday more than likely I discussed that with her today. she will need a port. Risk of bleeding, infection, collapsed lung, mediastinal injury, or catheter migration death. We'll refer to plastic surgery for evaluation for possible reconstruction. Recommend smoking cessation. Discussed treatment options for breast cancer to include breast conservation vs mastectomy with reconstruction. Pt has decided on mastectomy. Risk include bleeding, infection, flap necrosis, pain, numbness, recurrence, hematoma, other surgery needs. Pt understands and agrees to proceed. Risk of lymph node surgery include bleeding, infection, lymphedema, shoulder pain. stiffness, dye allergy. cosmetic deformity , blood clots, death, need for more surgery. Pt agres to proceed.  risk of port pplacement include bleeding, infection, collapsed lung, one vessel injumigration, DVT, catheter malfunction, catheter migration, cardiac injury, mediastinal injury,and the need for other procedures for vascular access.Alternatives discussed  Current Plans You are being scheduled for surgery - Our schedulers will call you.  You should hear from our office's scheduling  department within 5 working days about the location, date, and time of surgery. We try to make accommodations for patient's preferences in scheduling surgery, but sometimes the OR schedule or the surgeon's schedule prevents  [place] from making those accommodations.  If you have not heard from our office [phone]) in 5 working days, call the office and ask for your surgeon's nurse.  If you have other questions about your diagnosis, plan, or surgery, call the office and ask for your surgeon's nurse.  Pt Education - [facility] Breast Cancer Information Given - [employer] "Breast Journey" Package We discussed the staging and pathophysiology of breast cancer. We discussed all of the different options for treatment for breast cancer including surgery, chemotherapy, radiation therapy, Herceptin, and antiestrogen therapy. We discussed a sentinel lymph node biopsy as she does not appear to having lymph node involvement right now. We discussed the performance of that with injection of radioactive tracer and blue dye. We discussed that she would have an incision underneath her axillary hairline. We discussed that there is a bout a 10-20% chance of having a positive node with a sentinel lymph node biopsy and we will await the permanent pathology to make any other first further decisions in terms of her treatment. One of these options might be to return to the operating room to perform an axillary lymph node dissection. We discussed about a 1-2% risk lifetime of chronic shoulder pain as well as lymphedema associated with a sentinel lymph node biopsy. We discussed the options for treatment of the breast cancer which included lumpectomy versus a mastectomy. We discussed the performance of the lumpectomy with a wire placement. We discussed a 10-20% chance of a positive margin requiring reexcision in the operating room. We also discussed that she may need radiation therapy or antiestrogen therapy or both if she undergoes lumpectomy.  We discussed the mastectomy and the postoperative care for that as well. We discussed that there is no difference in her survival whether she undergoes lumpectomy with radiation therapy or antiestrogen therapy versus a mastectomy. There is a slight difference in the local recurrence rate being 3-5% with lumpectomy and about 1% with a mastectomy. We discussed the risks of operation including bleeding, infection, possible reoperation. She understands her further therapy will be based on what her stages at the time of her operation.  Pt Education - [facility] Mastectomy HCI Use of a central venous catheter for intravenous therapy was discussed. Technique of catheter placement using ultrasound and fluoroscopy guidance was discussed. Risks such as bleeding, infection, pneumothorax, catheter occlusion, reoperation, and other risks were discussed. I noted a good likelihood this will help address the problem. Questions were answered. The patient expressed understanding & wishes to proceed.

## 2015-10-28 NOTE — Op Note (Signed)
Preoperative  Diagnosis: Recurrent left breast cancer  Postoperative diagnosis: Same  Procedure: Left modified radical mastectomy with placement of right internal jugular 8 [place] Port-A-Cath with C-arm guidance and ultrasound guidance  Surgeon: Erroll Luna M.D.  Anesthesia: Gen. with pectoral block  EBL: 75 mL  Drains: 19 round drain to left mastectomy wound  Specimen: Left breast with axillary contents  Indications for procedure: The patient's a 60 year old female who had stage I left breast cancer in 2002. She underwent left lumpectomy at that time with sentinel lymph node mapping. This was a triple negative tumor she received postoperative chemotherapy. Over the last 4-5 months is developed a left breast mass. Core biopsy showed triple negative left breast cancer. We saw her in the multidisciplinary clinic and recommended left mastectomy with left axillary node dissection and placement of Port-A-Cath for postoperative chemotherapy for triple negative left breast cancer.The surgical and non surgical options have been discussed with the patient.  Risks of surgery include bleeding,  Infection,  Flap necrosis,  Tissue loss,  Chronic pain, death, Numbness,  And the need for additional procedures.  Reconstruction options also have been discussed with the patient as well.  The patient agrees to proceed. lymph dissection has been discussed with the patient.  Risk of bleeding,  Infection,  Seroma formation,  Additional procedures,,  Shoulder weakness ,  Shoulder stiffness,  Nerve and blood vessel injury and reaction to the mapping dyes have been discussed.  Alternatives to surgery have been discussed with the patient.  The patient agrees to proceed. Discussed risk of port placement and alternatives to port placement. Risk of bleeding, infection, collapsed lung, bleeding around lung, mediastinal injury, cardiac injury, death, DVT, catheter migration, catheter malfunction, and the need to replace the  catheter.    Description of procedure: The patient was met in the holding area and questions are answered. The left breast was marked with the assistance the patient is correct side. We discussed procedure great length as well as Port-A-Cath insertion again. She voiced her understanding and agreed to proceed. She was taken back to the operating room and placed upon the OR table. After induction of general anesthesia, both arms were tucked and a Port-A-Cath was placed first. The upper chest and neck regions were prepped and draped in a sterile fashion and timeout was done. Ultrasound was used to identify the right internal jugular vein. Under ultrasound guidance, a needle was inserted into the right internal jugular vein. There is good return of dark nonpulsatile blood. Wire was fed through this and with the assistance of the C-arm showed the wire going down the superior vena cava. Small incision was made at the wire insertion site. Just below the right clavicle a 3 cm incision was made small pocket was constructed with cautery. The 8 [place] Port-A-Cath was flushed and brought onto the field. It was attached at its hub. The catheter was tunneled from the lower incision to the upper incision. It was cut to approximately 19 cm. With the patient Trendelenburg the wire was used to guide for the dilator introducer complex. This was passed over the wire without resistance. The wire and dilator removed. The peel-away sheath was then used to insert the catheter peeled away. The tip of the catheterization visualized C-arm appeared to be in the mid superior vena cava. There are no complicating features. The catheter was interrogated with a syringe and blood was able to be extracted from the catheter without difficulty. He was and flushed with heparinized saline. This  had no resistance to it. We then secured the help of the catheter to the chest wall. 2-0 Prolene was used for this. We then closed the skin incisions with 3-0  Vicryl and 4-0 Monocryl. Liquid adhesive was applied.  The left mastectomy was done next. Superior and inferior curved linear incisions were made and superior skin flap and inferior skin flap created with cautery. The tumor is in the left breast upper outer quadrant was quite bulky. We then removed the breast in a medial to lateral fashion taking the pectoralis fascia with it. She had a previous sentinel lymph node mapping procedure and there is scarring in the left axilla. We then performed a completion axillary lymph node dissection. The long thoracic nerve was identified and preserved. The thoracodorsal trunk was identified and preserved. The axillary vein was identified to was preserved. All lymphovascular tissue between these landmarks were removed. Small vessels and lymphatics controlled with clips. The remainder the contents were taken with the mastectomy specimen passed off the field after orientation. We was irrigated. A 19 round Blake drain was placed the inferior flap and secured to skin with 2-0 nylon. We then closed mastectomy incision with 3-0 Vicryl and 4-0 Monocryl. Liquid adhesive applied. All final counts are found to be correct. Breast Binder was placed. The patient was in awoke extubated taken to recovery in satisfactory condition.

## 2015-10-28 NOTE — Discharge Instructions (Signed)
[facility] °[phone] ° °MASTECTOMY: POST OP INSTRUCTIONS ° °Always review your discharge instruction sheet given to you by the facility where your surgery was performed. °IF YOU HAVE DISABILITY OR FAMILY LEAVE FORMS, YOU MUST BRING THEM TO THE OFFICE FOR PROCESSING.   °DO NOT GIVE THEM TO YOUR DOCTOR. °A prescription for pain medication may be given to you upon discharge.  Take your pain medication as prescribed, if needed.  If narcotic pain medicine is not needed, then you may take acetaminophen (Tylenol) or ibuprofen (Advil) as needed. °1. Take your usually prescribed medications unless otherwise directed. °2. If you need a refill on your pain medication, please contact your pharmacy.  They will contact our office to request authorization.  Prescriptions will not be filled after 5pm or on week-ends. °3. You should follow a light diet the first few days after arrival home, such as soup and crackers, etc.  Resume your normal diet the day after surgery. °4. Most patients will experience some swelling and bruising on the chest and underarm.  Ice packs will help.  Swelling and bruising can take several days to resolve.  °5. It is common to experience some constipation if taking pain medication after surgery.  Increasing fluid intake and taking a stool softener (such as Colace) will usually help or prevent this problem from occurring.  A mild laxative (Milk of Magnesia or Miralax) should be taken according to package instructions if there are no bowel movements after 48 hours. °6. Unless discharge instructions indicate otherwise, leave your bandage dry and in place until your next appointment in 3-5 days.  You may take a limited sponge bath.  No tube baths or showers until the drains are removed.  You may have steri-strips (small skin tapes) in place directly over the incision.  These strips should be left on the skin for 7-10 days.  If your surgeon used skin glue on the incision, you may  shower in 24 hours.  The glue will flake off over the next 2-3 weeks.  Any sutures or staples will be removed at the office during your follow-up visit. °7. DRAINS:  If you have drains in place, it is important to keep a list of the amount of drainage produced each day in your drains.  Before leaving the hospital, you should be instructed on drain care.  Call our office if you have any questions about your drains. °8. ACTIVITIES:  You may resume regular (light) daily activities beginning the next day--such as daily self-care, walking, climbing stairs--gradually increasing activities as tolerated.  You may have sexual intercourse when it is comfortable.  Refrain from any heavy lifting or straining until approved by your doctor. °a. You may drive when you are no longer taking prescription pain medication, you can comfortably wear a seatbelt, and you can safely maneuver your car and apply brakes. °b. RETURN TO WORK:  __________________________________________________________ °9. You should see your doctor in the office for a follow-up appointment approximately 3-5 days after your surgery.  Your doctor’s nurse will typically make your follow-up appointment when she calls you with your pathology report.  Expect your pathology report 2-3 business days after your surgery.  You may call to check if you do not hear from us after three days.   °10. OTHER INSTRUCTIONS: ______________________________________________________________________________________________ ____________________________________________________________________________________________ °WHEN TO CALL YOUR DOCTOR: °1. Fever over 101.0 °2. Nausea and/or vomiting °3. Extreme swelling or bruising °4. Continued bleeding from incision. °5. Increased pain, redness, or drainage from the incision. °  The clinic staff is available to answer your questions during regular business hours.  Please dont hesitate to call and ask to speak to one of the nurses for clinical  concerns.  If you have a medical emergency, go to the nearest emergency room or call 911.  A surgeon from [facility] is always on call at the hospital. [address], [place], [place], [place]  [address] ? [address], [place], [place]   [address] [phone] ? [phone] ? FAX [phone] Web site: [place] A bulb drain consists of a thin rubber tube and a soft, round bulb that creates a gentle suction. The rubber tube is placed in the area where you had surgery. A bulb is attached to the end of the tube that is outside the body. The bulb drain removes excess fluid that normally builds up in a surgical wound after surgery. The color and amount of fluid will vary. Immediately after surgery, the fluid is bright red and is a little thicker than water. It may gradually change to a yellow or pink color and become more thin and water-like. When the amount decreases to about 1 or 2 tbsp in 24 hours, your health care provider will usually remove it. DAILY CARE  Keep the bulb flat (compressed) at all times, except while emptying it. The flatness creates suction. You can flatten the bulb by squeezing it firmly in the middle and then closing the cap.  Keep sites where the tube enters the skin dry and covered with a bandage (dressing).  Secure the tube 1-2 in (2.5-5.1 cm) below the insertion sites to keep it from pulling on your stitches. The tube is stitched in place and will not slip out.  Secure the bulb as directed by your health care provider.  For the first 3 days after surgery, there usually is more fluid in the bulb. Empty the bulb whenever it becomes half full because the bulb does not create enough suction if it is too full. The bulb could also overflow. Write down how much fluid you remove each time you empty your drain. Add up the amount removed in 24 hours.  Empty the bulb at the same time every day once the amount of fluid decreases and you  only need to empty it once a day. Write down the amounts and the 24-hour totals to give to your health care provider. This helps your health care provider know when the tubes can be removed. EMPTYING THE BULB DRAIN Before emptying the bulb, get a measuring cup, a piece of paper and a pen, and wash your hands.  Gently run your fingers down the tube (stripping) to empty any drainage from the tubing into the bulb. This may need to be done several times a day to clear the tubing of clots and tissue.  Open the bulb cap to release suction, which causes it to inflate. Do not touch the inside of the cap.  Gently run your fingers down the tube (stripping) to empty any drainage from the tubing into the bulb.  Hold the cap out of the way, and pour fluid into the measuring cup.   Squeeze the bulb to provide suction.  Replace the cap.   Check the tape that holds the tube to your skin. If it is becoming loose, you can remove the loose piece of tape and apply a new one. Then, pin the bulb to your shirt.   Write down  the amount of fluid you emptied out. Write down the date and each time you emptied your bulb drain. (If there are 2 bulbs, note the amount of drainage from each bulb and keep the totals separate. Your health care provider will want to know the total amounts for each drain and which tube is draining more.)   Flush the fluid down the toilet and wash your hands.   Call your health care provider once you have less than 2 tbsp of fluid collecting in the bulb drain every 24 hours. If there is drainage around the tube site, change dressings and keep the area dry. Cleanse around tube with sterile saline and place dry gauze around site. This gauze should be changed when it is soiled. If it stays clean and unsoiled, it should still be changed daily.  SEEK MEDICAL CARE IF:  Your drainage has a bad smell or is cloudy.   You have a fever.   Your drainage is increasing instead of decreasing.    Your tube fell out.   You have redness or swelling around the tube site.   You have drainage from a surgical wound.   Your bulb drain will not stay flat after you empty it.  MAKE SURE YOU:   Understand these instructions.  Will watch your condition.  Will get help right away if you are not doing well or get worse.   This information is not intended to replace advice given to you by your health care provider. Make sure you discuss any questions you have with your health care provider.   Document Released: 02/04/2000 Document Revised: 02/27/2014 Document Reviewed: 08/26/2014 Elsevier Interactive Patient Education 2016 [place]: POST OP INSTRUCTIONS  Always review your discharge instruction sheet given to you by the facility where your surgery was performed.   1. A prescription for pain medication may be given to you upon discharge. Take your pain medication as prescribed, if needed. If narcotic pain medicine is not needed, then you make take acetaminophen (Tylenol) or ibuprofen (Advil) as needed.  2. Take your usually prescribed medications unless otherwise directed. 3. If you need a refill on your pain medication, please contact our office. All narcotic pain medicine now requires a paper prescription.  Phoned in and fax refills are no longer allowed by law.  Prescriptions will not be filled after 5 pm or on weekends.  4. You should follow a light diet for the remainder of the day after your procedure. 5. Most patients will experience some mild swelling and/or bruising in the area of the incision. It may take several days to resolve. 6. It is common to experience some constipation if taking pain medication after surgery. Increasing fluid intake and taking a stool softener (such as Colace) will usually help or prevent this problem from occurring. A mild laxative (Milk of Magnesia or Miralax) should be taken according to package directions if there are  no bowel movements after 48 hours.  7. Unless discharge instructions indicate otherwise, you may remove your bandages 48 hours after surgery, and you may shower at that time. You may have steri-strips (small white skin tapes) in place directly over the incision.  These strips should be left on the skin for 7-10 days.  If your surgeon used Dermabond (skin glue) on the incision, you may shower in 24 hours.  The glue will flake off over the next 2-3 weeks.  8. If your port is left accessed at  the end of surgery (needle left in port), the dressing cannot get wet and should only by changed by a healthcare professional. When the port is no longer accessed (when the needle has been removed), follow step 7.   9. ACTIVITIES:  Limit activity involving your arms for the next 72 hours. Do no strenuous exercise or activity for 1 week. You may drive when you are no longer taking prescription pain medication, you can comfortably wear a seatbelt, and you can maneuver your car. 10.You may need to see your doctor in the office for a follow-up appointment.  Please       check with your doctor.  11.When you receive a new Port-a-Cath, you will get a product guide and        ID card.  Please keep them in case you need them.  WHEN TO CALL YOUR DOCTOR [phone]): 1. Fever over 101.0 2. Chills 3. Continued bleeding from incision 4. Increased redness and tenderness at the site 5. Shortness of breath, difficulty breathing   The clinic staff is available to answer your questions during regular business hours. Please dont hesitate to call and ask to speak to one of the nurses or medical assistants for clinical concerns. If you have a medical emergency, go to the nearest emergency room or call 911.  A surgeon from [facility] is always on call at the hospital.     For further information, please visit [email or web address]

## 2015-10-28 NOTE — Anesthesia Procedure Notes (Signed)
Anesthesia Regional Block:  Pectoralis block  Pre-Anesthetic Checklist: ,, timeout performed, Correct Patient, Correct Site, Correct Laterality, Correct Procedure, Correct Position, site marked, Risks and benefits discussed, Surgical consent,  Pre-op evaluation,  Post-op pain management  Laterality: Left  Prep: chloraprep       Needles:   Needle Type: Stimiplex     Needle Length: 9cm 9 cm     Additional Needles:  Procedures: ultrasound guided (picture in chart) Pectoralis block Narrative:  Injection made incrementally with aspirations every 5 mL.  Performed by: Personally  Anesthesiologist: Nolon Nations  Additional Notes: Patient tolerated well. Good fascial spread noted.

## 2015-10-28 NOTE — Anesthesia Postprocedure Evaluation (Signed)
Anesthesia Post Note  Patient: Leah Mcdowell  Procedure(s) Performed: Procedure(s) (LRB): LEFT SIMPLE MASTECTOMY WITH AXILLARY LYMPH NODE DISSECTION (Left) INSERTION PORT-A-CATH WITH ULTRASOUND GUIDANCE (Right)  Patient location during evaluation: PACU Anesthesia Type: General and Regional Level of consciousness: sedated and patient cooperative Pain management: pain level controlled Vital Signs Assessment: post-procedure vital signs reviewed and stable Respiratory status: spontaneous breathing Cardiovascular status: stable Anesthetic complications: no    Last Vitals:  Vitals:   10/28/15 0959 10/28/15 1045  BP: 123/63   Pulse: 65   Resp: 10   Temp:  36.8 C    Last Pain:  Vitals:   10/28/15 0558  TempSrc: Oral                 Nolon Nations

## 2015-10-29 ENCOUNTER — Encounter (HOSPITAL_COMMUNITY): Payer: Self-pay | Admitting: Surgery

## 2015-10-29 ENCOUNTER — Other Ambulatory Visit: Payer: BLUE CROSS/BLUE SHIELD

## 2015-10-29 DIAGNOSIS — C50412 Malignant neoplasm of upper-outer quadrant of left female breast: Secondary | ICD-10-CM | POA: Diagnosis not present

## 2015-10-29 LAB — BASIC METABOLIC PANEL
Anion gap: 5 (ref 5–15)
BUN: 8 mg/dL (ref 6–20)
CALCIUM: 8.7 mg/dL — AB (ref 8.9–10.3)
CO2: 24 mmol/L (ref 22–32)
CREATININE: 0.63 mg/dL (ref 0.44–1.00)
Chloride: 105 mmol/L (ref 101–111)
Glucose, Bld: 130 mg/dL — ABNORMAL HIGH (ref 65–99)
Potassium: 3.8 mmol/L (ref 3.5–5.1)
SODIUM: 134 mmol/L — AB (ref 135–145)

## 2015-10-29 LAB — CBC
HCT: 34.2 % — ABNORMAL LOW (ref 36.0–46.0)
Hemoglobin: 11.1 g/dL — ABNORMAL LOW (ref 12.0–15.0)
MCH: 32.5 pg (ref 26.0–34.0)
MCHC: 32.5 g/dL (ref 30.0–36.0)
MCV: 100 fL (ref 78.0–100.0)
PLATELETS: 236 10*3/uL (ref 150–400)
RBC: 3.42 MIL/uL — ABNORMAL LOW (ref 3.87–5.11)
RDW: 12.7 % (ref 11.5–15.5)
WBC: 12.4 10*3/uL — ABNORMAL HIGH (ref 4.0–10.5)

## 2015-10-29 MED ORDER — OXYCODONE HCL 5 MG PO TABS: 5.0000 mg | ORAL_TABLET | Freq: Four times a day (QID) | ORAL | 0 refills | Status: DC | PRN

## 2015-10-29 NOTE — Progress Notes (Signed)
1 Day Post-Op  Subjective: She is doing well.  Her husband is here with her.  She feels ready to go home. Pain well controlled.  Tolerating diet without nausea.  Ambulating in room.  Voiding without difficulty JP drainage serosanguineous, 255 mL out since surgery.  Objective: Vital signs in last 24 hours: Temp:  [97.3 F (36.3 C)-98.4 F (36.9 C)] 98.4 F (36.9 C) (09/08 0300) Pulse Rate:  [65-93] 74 (09/08 0300) Resp:  [9-24] 16 (09/08 0300) BP: (98-126)/(52-85) 104/62 (09/08 0300) SpO2:  [95 %-100 %] 96 % (09/08 0300) Weight:  [47.9 kg (105 lb 9.6 oz)] 47.9 kg (105 lb 9.6 oz) (09/07 1102) Last BM Date: 10/27/15  Intake/Output from previous day: 09/07 0701 - 09/08 0700 In: 3185 [P.O.:480; I.V.:2605; IV Piggyback:100] Out: 2480 [Urine:2200; Drains:255; Blood:25] Intake/Output this shift: No intake/output data recorded.  General appearance: Alert.  Very talkative.  Cooperative.  Pleasant Resp: clear to auscultation bilaterally Chest wall: no tenderness, Left mastectomy wound and right infraclavicular port site wounds all look good.  No swelling or hematoma.  Skin flaps looked healthy and viable.  Drainage serosanguineous.  Lab Results:  Results for orders placed or performed during the hospital encounter of 10/28/15 (from the past 24 hour(s))  CBC     Status: None   Collection Time: 10/28/15 11:21 AM  Result Value Ref Range   WBC 9.8 4.0 - 10.5 K/uL   RBC 3.95 3.87 - 5.11 MIL/uL   Hemoglobin 12.9 12.0 - 15.0 g/dL   HCT 39.3 36.0 - 46.0 %   MCV 99.5 78.0 - 100.0 fL   MCH 32.7 26.0 - 34.0 pg   MCHC 32.8 30.0 - 36.0 g/dL   RDW 12.5 11.5 - 15.5 %   Platelets 261 150 - 400 K/uL  Creatinine, serum     Status: None   Collection Time: 10/28/15 11:21 AM  Result Value Ref Range   Creatinine, Ser 0.63 0.44 - 1.00 mg/dL   GFR calc non Af Amer >60 >60 mL/min   GFR calc Af Amer >60 >60 mL/min  CBC     Status: Abnormal   Collection Time: 10/29/15  3:51 AM  Result Value Ref Range    WBC 12.4 (H) 4.0 - 10.5 K/uL   RBC 3.42 (L) 3.87 - 5.11 MIL/uL   Hemoglobin 11.1 (L) 12.0 - 15.0 g/dL   HCT 34.2 (L) 36.0 - 46.0 %   MCV 100.0 78.0 - 100.0 fL   MCH 32.5 26.0 - 34.0 pg   MCHC 32.5 30.0 - 36.0 g/dL   RDW 12.7 11.5 - 15.5 %   Platelets 236 150 - 400 K/uL  Basic metabolic panel     Status: Abnormal   Collection Time: 10/29/15  3:51 AM  Result Value Ref Range   Sodium 134 (L) 135 - 145 mmol/L   Potassium 3.8 3.5 - 5.1 mmol/L   Chloride 105 101 - 111 mmol/L   CO2 24 22 - 32 mmol/L   Glucose, Bld 130 (H) 65 - 99 mg/dL   BUN 8 6 - 20 mg/dL   Creatinine, Ser 0.63 0.44 - 1.00 mg/dL   Calcium 8.7 (L) 8.9 - 10.3 mg/dL   GFR calc non Af Amer >60 >60 mL/min   GFR calc Af Amer >60 >60 mL/min   Anion gap 5 5 - 15     Studies/Results: Dg Chest Port 1 View  Result Date: 10/28/2015 CLINICAL DATA:  Port-A-Cath insertion. EXAM: PORTABLE CHEST 1 VIEW COMPARISON:  None. FINDINGS: Right  upper anterior chest Port-A-Cath extends into the right internal jugular vein. Tip projects in the mid superior vena cava. No pneumothorax. There is a left chest tube that projects extending between the lateral left third and fourth ribs. Tip projects over the left cardiac silhouette. There is a small amount of subcutaneous air in the supraclavicular regions bilaterally and along the left lateral chest wall. Surgical vascular clips project over the left axilla. Cardiac silhouette is normal in size. No mediastinal or hilar masses. Minor reticular opacity at the left lung base is likely atelectasis. Lungs are otherwise clear. IMPRESSION: 1. Right-sided Port-A-Cath tip projects in the mid superior vena cava. 2. No pneumothorax. 3. Left chest tube positioned as described. No acute findings in the lungs. Electronically Signed   By: Lajean Manes M.D.   On: 10/28/2015 10:33   Dg Fluoro Guide Cv Line-no Report  Result Date: 10/28/2015 CLINICAL DATA:  FLOURO GUIDE CV LINE Fluoroscopy was utilized by the requesting  physician.  No radiographic interpretation.    . enoxaparin (LOVENOX) injection  40 mg Subcutaneous Q24H     Assessment/Plan: s/p Procedure(s): LEFT SIMPLE MASTECTOMY WITH AXILLARY LYMPH NODE DISSECTION INSERTION PORT-A-CATH WITH ULTRASOUND GUIDANCE   POD #1.  Left MRM and port placement. Doing well without apparent complications She meets discharge criteria and wants to go home Diet, activity and wound care discussed in detail Prescription for OxyIR given She has follow-up with Dr. Brantley Stage September 20  @PROBHOSP @  LOS: 0 days    Larz Mark M 10/29/2015  . .prob

## 2015-11-01 NOTE — Discharge Summary (Signed)
Physician Discharge Summary  Patient ID: Leah Mcdowell MRN: [id number] DOB/AGE: May 02, 1955 60 y.o.  Admit date: 10/28/2015 Discharge date: 11/01/2015  Admission Diagnoses:LEFT BREAST CANCER   Discharge Diagnoses:  Active Problems:   Breast cancer of upper-outer quadrant of left female breast [facility])   Discharged Condition: good  Hospital Course: UNREMARKABLE  Pt did well Good pain control Wound clean  Drains serous   Consults: None    Treatments: surgery: LEFT MASTECTOMY   Discharge Exam: Blood pressure 98/60, pulse 67, temperature 98.5 F (36.9 C), resp. rate 16, height 5\' 8"  (1.727 m), weight 47.9 kg (105 lb 9.6 oz), SpO2 99 %. Incision/Wound:flaps viable port site clean  Serous drainage   Disposition: 01-Home or Self Care  Discharge Instructions    Call MD for:  difficulty breathing, headache or visual disturbances    Complete by:  As directed   Call MD for:  hives    Complete by:  As directed   Call MD for:  persistant dizziness or light-headedness    Complete by:  As directed   Call MD for:  persistant nausea and vomiting    Complete by:  As directed   Call MD for:  redness, tenderness, or signs of infection (pain, swelling, redness, odor or [place]/yellow discharge around incision site)    Complete by:  As directed   Call MD for:  severe uncontrolled pain    Complete by:  As directed   Call MD for:  temperature >100.4    Complete by:  As directed   Diet - low sodium heart healthy    Complete by:  As directed   Discharge wound care:    Complete by:  As directed   Keep a written record of the drainage and bring that record to the office with you for each office visit Keep the wounds clean and dry other than taking a quick shower Cover the wounds with dry bandages and the elastic binder You may change the bandage if it becomes wet or soiled Move your shoulder around frequently   Driving Restrictions    Complete by:  As directed   2 weeks   Increase activity slowly     Complete by:  As directed   Lifting restrictions    Complete by:  As directed   15 lbs   May shower / Bathe    Complete by:  As directed   May walk up steps    Complete by:  As directed       Medication List    TAKE these medications   methocarbamol 500 MG tablet Commonly known as:  ROBAXIN Take 1 tablet (500 mg total) by mouth every 6 (six) hours as needed for muscle spasms.   oxyCODONE 5 MG immediate release tablet Commonly known as:  Oxy IR/ROXICODONE Take 1-2 tablets (5-10 mg total) by mouth every 4 (four) hours as needed for moderate pain.   oxyCODONE 5 MG immediate release tablet Commonly known as:  Oxy IR/ROXICODONE Take 1-2 tablets (5-10 mg total) by mouth every 6 (six) hours as needed for severe pain.      Follow-up Information    Chi Woodham A., MD Follow up in 1 week(s).   Specialty:  General Surgery Why:  Keep your appointment on September 20, as scheduled Contact information: [address] [place] [address] [phone]           Signed: Turner Daniels. 11/01/2015, 12:35 PM

## 2015-11-04 ENCOUNTER — Encounter: Payer: Self-pay | Admitting: Hematology and Oncology

## 2015-11-04 ENCOUNTER — Encounter: Payer: Self-pay | Admitting: *Deleted

## 2015-11-04 ENCOUNTER — Other Ambulatory Visit: Payer: BLUE CROSS/BLUE SHIELD

## 2015-11-04 ENCOUNTER — Ambulatory Visit (HOSPITAL_BASED_OUTPATIENT_CLINIC_OR_DEPARTMENT_OTHER): Payer: BLUE CROSS/BLUE SHIELD | Admitting: Hematology and Oncology

## 2015-11-04 DIAGNOSIS — Z171 Estrogen receptor negative status [ER-]: Secondary | ICD-10-CM

## 2015-11-04 DIAGNOSIS — C50412 Malignant neoplasm of upper-outer quadrant of left female breast: Secondary | ICD-10-CM

## 2015-11-04 NOTE — Assessment & Plan Note (Signed)
Left mastectomy 10/28/2015: IDC grade 3, 3 cm, lymphovascular invasion present, margins negative, 0/6 lymph nodes, ER 0%, PR 0%, HER-2 negative, Ki-67 60%, T2 N0 stage II a  Pathology counseling: I discussed the final pathology report of the patient provided  a copy of this report. I discussed the margins as well as lymph node surgeries. We also discussed the final staging along with previously performed ER/PR and HER-2/neu testing.  Recommendation: Patient refused genetic counseling 1. Left mastectomy with lymph node dissection 2. Followed by adjuvant chemotherapy with dose dense Adriamycin and Cytoxan every 2 weeks 4 followed by weekly Taxol 12 No role of adjuvant radiation if the lymph nodes and margins are negative. No role for antiestrogen therapy since she is triple negative  Patient had an echocardiogram which was normal. Chemotherapy education is being done today.  Plan to start chemotherapy on 11/25/2015.

## 2015-11-04 NOTE — Progress Notes (Signed)
Patient Care Team: Berkley Harvey, NP as PCP - General (Nurse Practitioner) Erroll Luna, MD as Consulting Physician (General Surgery) Nicholas Lose, MD as Consulting Physician (Hematology and Oncology) Kyung Rudd, MD as Consulting Physician (Radiation Oncology)  DIAGNOSIS: Breast cancer of upper-outer quadrant of left female breast [facility])   Staging form: Breast, AJCC 7th Edition   - Clinical stage from 09/29/2015: Stage IIA (T2, N0, M0) - Unsigned  SUMMARY OF ONCOLOGIC HISTORY:   Breast cancer of upper-outer quadrant of left female breast ([place])   09/28/2000 Initial Biopsy    Left lumpectomy for stage I breast cancer that was ER/PR negative followed by adjuvant radiation      09/20/2015 Initial Diagnosis    Left breast mass 3 cm 1:00 position: IDC grade 2, ER/PR negative, Ki-67 60%, HER-2 negative, T2 N0 stage IIA clinical stage      10/28/2015 Surgery    Left mastectomy: IDC grade 3, 3 cm, lymphovascular invasion present, margins negative, 0/6 lymph nodes, ER 0%, PR 0%, HER-2 negative, Ki-67 60%, T2 N0 stage II a       CHIEF COMPLIANT: Follow-up after recent left mastectomy  INTERVAL HISTORY: Leah Mcdowell is a 60 year old with above-mentioned history left breast cancer who underwent mastectomy and is here today to discuss the pathology report. She is very sore from the surgery standpoint but is getting better over time. She continues to have a drain.  REVIEW OF SYSTEMS:   Constitutional: Denies fevers, chills or abnormal weight loss Eyes: Denies blurriness of vision Ears, nose, mouth, throat, and face: Denies mucositis or sore throat Respiratory: Denies cough, dyspnea or wheezes Cardiovascular: Denies palpitation, chest discomfort Gastrointestinal:  Denies nausea, heartburn or change in bowel habits Skin: Denies abnormal skin rashes Lymphatics: Denies new lymphadenopathy or easy bruising Neurological:Denies numbness, tingling or new weaknesses Behavioral/Psych: Mood is stable,  no new changes  Extremities: No lower extremity edema Breast: Recent left mastectomy All other systems were reviewed with the patient and are negative.  I have reviewed the past medical history, past surgical history, social history and family history with the patient and they are unchanged from previous note.  ALLERGIES:  is allergic to no known allergies.  MEDICATIONS:  Current Outpatient Prescriptions  Medication Sig Dispense Refill  . methocarbamol (ROBAXIN) 500 MG tablet Take 1 tablet (500 mg total) by mouth every 6 (six) hours as needed for muscle spasms. 30 tablet 0  . oxyCODONE (OXY IR/ROXICODONE) 5 MG immediate release tablet Take 1-2 tablets (5-10 mg total) by mouth every 4 (four) hours as needed for moderate pain. 30 tablet 0  . oxyCODONE (OXY IR/ROXICODONE) 5 MG immediate release tablet Take 1-2 tablets (5-10 mg total) by mouth every 6 (six) hours as needed for severe pain. 30 tablet 0   No current facility-administered medications for this visit.     PHYSICAL EXAMINATION: ECOG PERFORMANCE STATUS: 1 - Symptomatic but completely ambulatory  Vitals:   11/04/15 0923  BP: (!) 128/91  Pulse: 71  Resp: 18  Temp: 97.7 F (36.5 C)   Filed Weights   11/04/15 0923  Weight: 105 lb 1.6 oz (47.7 kg)    GENERAL:alert, no distress and comfortable SKIN: skin color, texture, turgor are normal, no rashes or significant lesions EYES: normal, Conjunctiva are pink and non-injected, sclera clear OROPHARYNX:no exudate, no erythema and lips, buccal mucosa, and tongue normal  NECK: supple, thyroid normal size, non-tender, without nodularity LYMPH:  no palpable lymphadenopathy in the cervical, axillary or inguinal LUNGS: clear to  auscultation and percussion with normal breathing effort HEART: regular rate & rhythm and no murmurs and no lower extremity edema ABDOMEN:abdomen soft, non-tender and normal bowel sounds MUSCULOSKELETAL:no cyanosis of digits and no clubbing  NEURO: alert &  oriented x 3 with fluent speech, no focal motor/sensory deficits EXTREMITIES: No lower extremity edema  LABORATORY DATA:  I have reviewed the data as listed   Chemistry      Component Value Date/Time   NA 134 (L) 10/29/2015 0351   NA 139 09/29/2015 0837   K 3.8 10/29/2015 0351   K 4.2 09/29/2015 0837   CL 105 10/29/2015 0351   CO2 24 10/29/2015 0351   CO2 26 09/29/2015 0837   BUN 8 10/29/2015 0351   BUN 13.6 09/29/2015 0837   CREATININE 0.63 10/29/2015 0351   CREATININE 0.7 09/29/2015 0837      Component Value Date/Time   CALCIUM 8.7 (L) 10/29/2015 0351   CALCIUM 9.6 09/29/2015 0837   ALKPHOS 103 09/29/2015 0837   AST 19 09/29/2015 0837   ALT 17 09/29/2015 0837   BILITOT 0.43 09/29/2015 0837       Lab Results  Component Value Date   WBC 12.4 (H) 10/29/2015   HGB 11.1 (L) 10/29/2015   HCT 34.2 (L) 10/29/2015   MCV 100.0 10/29/2015   PLT 236 10/29/2015   NEUTROABS 5.2 09/29/2015     ASSESSMENT & PLAN:  Breast cancer of upper-outer quadrant of left female breast ([place]) Left mastectomy 10/28/2015: IDC grade 3, 3 cm, lymphovascular invasion present, margins negative, 0/6 lymph nodes, ER 0%, PR 0%, HER-2 negative, Ki-67 60%, T2 N0 stage II a  Pathology counseling: I discussed the final pathology report of the patient provided  a copy of this report. I discussed the margins as well as lymph node surgeries. We also discussed the final staging along with previously performed ER/PR and HER-2/neu testing.  Recommendation: Patient refused genetic counseling 1. Left mastectomy with lymph node dissection 2. Followed by adjuvant chemotherapy with dose dense Adriamycin and Cytoxan every 2 weeks 4 followed by weekly Taxol 12 No role of adjuvant radiation if the lymph nodes and margins are negative. No role for antiestrogen therapy since she is triple negative  Patient had an echocardiogram which was normal. Chemotherapy education is being done today.  Plan to start  chemotherapy on 11/25/2015.   No orders of the defined types were placed in this encounter.  The patient has a good understanding of the overall plan. she agrees with it. she will call with any problems that may develop before the next visit here.   Rulon Eisenmenger, MD 11/04/15

## 2015-11-22 ENCOUNTER — Other Ambulatory Visit: Payer: Self-pay | Admitting: Hematology and Oncology

## 2015-11-22 DIAGNOSIS — Z171 Estrogen receptor negative status [ER-]: Principal | ICD-10-CM

## 2015-11-22 DIAGNOSIS — C50412 Malignant neoplasm of upper-outer quadrant of left female breast: Secondary | ICD-10-CM

## 2015-11-22 MED ORDER — PROCHLORPERAZINE MALEATE 10 MG PO TABS: 10.0000 mg | ORAL_TABLET | Freq: Four times a day (QID) | ORAL | 1 refills | Status: DC | PRN

## 2015-11-22 MED ORDER — DEXAMETHASONE 4 MG PO TABS: 4.0000 mg | ORAL_TABLET | Freq: Every day | ORAL | 1 refills | Status: AC

## 2015-11-22 MED ORDER — LORAZEPAM 0.5 MG PO TABS: 0.5000 mg | ORAL_TABLET | Freq: Four times a day (QID) | ORAL | 0 refills | Status: DC | PRN

## 2015-11-22 MED ORDER — LIDOCAINE-PRILOCAINE 2.5-2.5 % EX CREA: TOPICAL_CREAM | CUTANEOUS | 3 refills | Status: DC

## 2015-11-22 MED ORDER — ONDANSETRON HCL 8 MG PO TABS: 8.0000 mg | ORAL_TABLET | Freq: Two times a day (BID) | ORAL | 1 refills | Status: DC | PRN

## 2015-11-22 NOTE — Progress Notes (Signed)
START ON PATHWAY REGIMEN - Breast  [id number]: AC-T - [Doxorubicin + Cyclophosphamide q21 Days x 4 Cycles, Followed by Paclitaxel Weekly x 12 Weeks]  Doxorubicin + Cyclophosphamide (AC):   A cycle is every 21 days:     Doxorubicin (Adriamycin(R)) 60 mg/m2 IV Push followed by Dose Mod: None     Cyclophosphamide (Cytoxan(R)) 600 mg/m2 in 250 mL NS IV over 30 minutes Dose Mod: None  **Always confirm dose/schedule in your pharmacy ordering system**    Paclitaxel 80 mg/m2 Weekly:   Administer weekly:     Paclitaxel (Taxol(R)) 80 mg/m2 in 250 mL NS IV over 1 hour Dose Mod: None  **Always confirm dose/schedule in your pharmacy ordering system**    Patient Characteristics: Adjuvant Therapy, Node Negative, HER2/neu Negative/Unknown/Equivocal, ER Negative AJCC Stage Grouping: IIA Current Disease Status: No Distant Mets or Local Recurrence AJCC M Stage: 0 ER Status: Negative (-) AJCC N Stage: 0 AJCC T Stage: 2 HER2/neu: Negative (-) PR Status: Negative (-) Node Status: Negative (-)  Intent of Therapy: Curative Intent, Discussed with Patient

## 2015-11-23 NOTE — Assessment & Plan Note (Signed)
Left mastectomy 10/28/2015: IDC grade 3, 3 cm, lymphovascular invasion present, margins negative, 0/6 lymph nodes, ER 0%, PR 0%, HER-2 negative, Ki-67 60%, T2 N0 stage II a  Treatment plan: Adjuvant chemotherapy with dose dense Adriamycin and Cytoxan every 2 weeks 4 followed by weekly Taxol 12 No role of adjuvant radiation if the lymph nodes and margins are negative. No role for antiestrogen therapy since she is triple negative. ------------------------------------------------------------------------------------------------------------------------------------------------------------ Current treatment: Today's cycle 1 day 1 of dose dense Adriamycin and Cytoxan. Antiemetics were reviewed Echocardiogram 10/15/2015: EF 60-65% Chemotherapy education had been completed Answered all of their questions.  Return to clinic in one week for toxicity check.

## 2015-11-25 ENCOUNTER — Encounter: Payer: Self-pay | Admitting: Hematology and Oncology

## 2015-11-25 ENCOUNTER — Other Ambulatory Visit (HOSPITAL_BASED_OUTPATIENT_CLINIC_OR_DEPARTMENT_OTHER): Payer: BLUE CROSS/BLUE SHIELD

## 2015-11-25 ENCOUNTER — Ambulatory Visit (HOSPITAL_BASED_OUTPATIENT_CLINIC_OR_DEPARTMENT_OTHER): Payer: BLUE CROSS/BLUE SHIELD

## 2015-11-25 ENCOUNTER — Encounter: Payer: Self-pay | Admitting: *Deleted

## 2015-11-25 ENCOUNTER — Ambulatory Visit (HOSPITAL_BASED_OUTPATIENT_CLINIC_OR_DEPARTMENT_OTHER): Payer: BLUE CROSS/BLUE SHIELD | Admitting: Hematology and Oncology

## 2015-11-25 DIAGNOSIS — C50412 Malignant neoplasm of upper-outer quadrant of left female breast: Secondary | ICD-10-CM

## 2015-11-25 DIAGNOSIS — Z171 Estrogen receptor negative status [ER-]: Secondary | ICD-10-CM | POA: Diagnosis not present

## 2015-11-25 DIAGNOSIS — Z5111 Encounter for antineoplastic chemotherapy: Secondary | ICD-10-CM

## 2015-11-25 LAB — CBC WITH DIFFERENTIAL/PLATELET
BASO%: 1 % (ref 0.0–2.0)
Basophils Absolute: 0.1 10*3/uL (ref 0.0–0.1)
EOS%: 3.4 % (ref 0.0–7.0)
Eosinophils Absolute: 0.3 10*3/uL (ref 0.0–0.5)
HEMATOCRIT: 42.6 % (ref 34.8–46.6)
HGB: 14.2 g/dL (ref 11.6–15.9)
LYMPH#: 2.3 10*3/uL (ref 0.9–3.3)
LYMPH%: 29.3 % (ref 14.0–49.7)
MCH: 32.7 pg (ref 25.1–34.0)
MCHC: 33.4 g/dL (ref 31.5–36.0)
MCV: 97.7 fL (ref 79.5–101.0)
MONO#: 0.8 10*3/uL (ref 0.1–0.9)
MONO%: 10.3 % (ref 0.0–14.0)
NEUT#: 4.4 10*3/uL (ref 1.5–6.5)
NEUT%: 56 % (ref 38.4–76.8)
Platelets: 319 10*3/uL (ref 145–400)
RBC: 4.36 10*6/uL (ref 3.70–5.45)
RDW: 12.5 % (ref 11.2–14.5)
WBC: 7.9 10*3/uL (ref 3.9–10.3)

## 2015-11-25 LAB — COMPREHENSIVE METABOLIC PANEL
ALT: 24 U/L (ref 0–55)
AST: 24 U/L (ref 5–34)
Albumin: 4 g/dL (ref 3.5–5.0)
Alkaline Phosphatase: 121 U/L (ref 40–150)
Anion Gap: 9 mEq/L (ref 3–11)
BUN: 12.7 mg/dL (ref 7.0–26.0)
CHLORIDE: 102 meq/L (ref 98–109)
CO2: 27 meq/L (ref 22–29)
CREATININE: 0.7 mg/dL (ref 0.6–1.1)
Calcium: 9.9 mg/dL (ref 8.4–10.4)
EGFR: >90 mL/min/{1.73_m2} (ref >90)
GLUCOSE: 73 mg/dL (ref 70–140)
Potassium: 4.4 mEq/L (ref 3.5–5.1)
SODIUM: 137 meq/L (ref 136–145)
Total Bilirubin: 0.49 mg/dL (ref 0.20–1.20)
Total Protein: 7.1 g/dL (ref 6.4–8.3)

## 2015-11-25 MED ORDER — SODIUM CHLORIDE 0.9% FLUSH
10.0000 mL | INTRAVENOUS | Status: DC | PRN
  Administered 2015-11-25: 10 mL
  Filled 2015-11-25: qty 10

## 2015-11-25 MED ORDER — CYCLOPHOSPHAMIDE CHEMO INJECTION 1 GM
600.0000 mg/m2 | Freq: Once | INTRAMUSCULAR | Status: AC
  Administered 2015-11-25: 900 mg via INTRAVENOUS
  Filled 2015-11-25: qty 45

## 2015-11-25 MED ORDER — DOXORUBICIN HCL CHEMO IV INJECTION 2 MG/ML
60.0000 mg/m2 | Freq: Once | INTRAVENOUS | Status: AC
  Administered 2015-11-25: 90 mg via INTRAVENOUS
  Filled 2015-11-25: qty 45

## 2015-11-25 MED ORDER — HEPARIN SOD (PORK) LOCK FLUSH 100 UNIT/ML IV SOLN
500.0000 [IU] | Freq: Once | INTRAVENOUS | Status: AC | PRN
  Administered 2015-11-25: 500 [IU]
  Filled 2015-11-25: qty 5

## 2015-11-25 MED ORDER — FOSAPREPITANT DIMEGLUMINE INJECTION 150 MG
Freq: Once | INTRAVENOUS | Status: AC
  Administered 2015-11-25: 11:00:00 via INTRAVENOUS
  Filled 2015-11-25: qty 5

## 2015-11-25 MED ORDER — PALONOSETRON HCL INJECTION 0.25 MG/5ML
INTRAVENOUS | Status: AC
  Filled 2015-11-25: qty 5

## 2015-11-25 MED ORDER — PEGFILGRASTIM 6 MG/0.6ML ~~LOC~~ PSKT
6.0000 mg | PREFILLED_SYRINGE | Freq: Once | SUBCUTANEOUS | Status: AC
  Administered 2015-11-25: 6 mg via SUBCUTANEOUS
  Filled 2015-11-25: qty 0.6

## 2015-11-25 MED ORDER — PALONOSETRON HCL INJECTION 0.25 MG/5ML
0.2500 mg | Freq: Once | INTRAVENOUS | Status: AC
  Administered 2015-11-25: 0.25 mg via INTRAVENOUS

## 2015-11-25 MED ORDER — SODIUM CHLORIDE 0.9 % IV SOLN
Freq: Once | INTRAVENOUS | Status: AC
  Administered 2015-11-25: 11:00:00 via INTRAVENOUS

## 2015-11-25 NOTE — Patient Instructions (Signed)
[facility] Discharge Instructions for Patients Receiving Chemotherapy  Today you received the following chemotherapy agents: Adriamycin and Cytoxan.  To help prevent nausea and vomiting after your treatment, we encourage you to take your nausea medication: Compazine. Take one every 6 hours as needed.  If you develop nausea and vomiting that is not controlled by your nausea medication, call the clinic.   BELOW ARE SYMPTOMS THAT SHOULD BE REPORTED IMMEDIATELY:  *FEVER GREATER THAN 100.5 F  *CHILLS WITH OR WITHOUT FEVER  NAUSEA AND VOMITING THAT IS NOT CONTROLLED WITH YOUR NAUSEA MEDICATION  *UNUSUAL SHORTNESS OF BREATH  *UNUSUAL BRUISING OR BLEEDING  TENDERNESS IN MOUTH AND THROAT WITH OR WITHOUT PRESENCE OF ULCERS  *URINARY PROBLEMS  *BOWEL PROBLEMS  UNUSUAL RASH Items with * indicate a potential emergency and should be followed up as soon as possible.  Feel free to call the clinic should you have any questions or concerns. The clinic phone number is [phone].  Please show the [place] at check-in to the Emergency Department and triage nurse.

## 2015-11-25 NOTE — Progress Notes (Signed)
Patient Care Team: Berkley Harvey, NP as PCP - General (Nurse Practitioner) Erroll Luna, MD as Consulting Physician (General Surgery) Nicholas Lose, MD as Consulting Physician (Hematology and Oncology) Kyung Rudd, MD as Consulting Physician (Radiation Oncology)  DIAGNOSIS: Breast cancer of upper-outer quadrant of left female breast [facility])   Staging form: Breast, AJCC 7th Edition   - Clinical stage from 09/29/2015: Stage IIA (T2, N0, M0) - Unsigned  SUMMARY OF ONCOLOGIC HISTORY:   Breast cancer of upper-outer quadrant of left female breast ([place])   09/28/2000 Initial Biopsy    Left lumpectomy for stage I breast cancer that was ER/PR negative followed by adjuvant radiation      09/20/2015 Initial Diagnosis    Left breast mass 3 cm 1:00 position: IDC grade 2, ER/PR negative, Ki-67 60%, HER-2 negative, T2 N0 stage IIA clinical stage      10/28/2015 Surgery    Left mastectomy: IDC grade 3, 3 cm, lymphovascular invasion present, margins negative, 0/6 lymph nodes, ER 0%, PR 0%, HER-2 negative, Ki-67 60%, T2 N0 stage II a      11/25/2015 -  Chemotherapy    Adjuvant chemotherapy with Dose dense Adriamycin and Cytoxan 4 followed by Taxol 12        CHIEF COMPLIANT: Cycle 1 day 1 dose dense Adriamycin and Cytoxan  INTERVAL HISTORY: Leah Mcdowell is a 60 year old with above-mentioned history left breast cancer underwent mastectomy and is here to start adjuvant chemotherapy. Today is cycle 1 day 1 of dose dense Adriamycin Cytoxan. Patient had gone through chemotherapy education and chemotherapy counseling was performed. She underwent echocardiogram which was normal. All the antiemetics were reviewed today. She is anxious to get started with her chemotherapy.  REVIEW OF SYSTEMS:   Constitutional: Denies fevers, chills or abnormal weight loss Eyes: Denies blurriness of vision Ears, nose, mouth, throat, and face: Denies mucositis or sore throat Respiratory: Denies cough, dyspnea or  wheezes Cardiovascular: Denies palpitation, chest discomfort Gastrointestinal:  Denies nausea, heartburn or change in bowel habits Skin: Denies abnormal skin rashes Lymphatics: Denies new lymphadenopathy or easy bruising Neurological:Denies numbness, tingling or new weaknesses Behavioral/Psych: Mood is stable, no new changes  Extremities: No lower extremity edema Breast: Left mastectomy All other systems were reviewed with the patient and are negative.  I have reviewed the past medical history, past surgical history, social history and family history with the patient and they are unchanged from previous note.  ALLERGIES:  is allergic to no known allergies.  MEDICATIONS:  Current Outpatient Prescriptions  Medication Sig Dispense Refill  . gabapentin (NEURONTIN) 300 MG capsule Take 300 mg by mouth 3 (three) times daily.    Marland Kitchen lidocaine-prilocaine (EMLA) cream Apply to affected area once 30 g 3  . LORazepam (ATIVAN) 0.5 MG tablet Take 1 tablet (0.5 mg total) by mouth every 6 (six) hours as needed (Nausea or vomiting). 30 tablet 0  . ondansetron (ZOFRAN) 8 MG tablet Take 1 tablet (8 mg total) by mouth 2 (two) times daily as needed. Start on the third day after chemotherapy. 30 tablet 1  . prochlorperazine (COMPAZINE) 10 MG tablet Take 1 tablet (10 mg total) by mouth every 6 (six) hours as needed (Nausea or vomiting). 30 tablet 1   No current facility-administered medications for this visit.     PHYSICAL EXAMINATION: ECOG PERFORMANCE STATUS: 1 - Symptomatic but completely ambulatory  Vitals:   11/25/15 0916  BP: (!) 137/91  Pulse: 76  Resp: 18  Temp: 97.7 F (36.5 C)  Filed Weights   11/25/15 0916  Weight: 107 lb 3.2 oz (48.6 kg)    GENERAL:alert, no distress and comfortable SKIN: skin color, texture, turgor are normal, no rashes or significant lesions EYES: normal, Conjunctiva are pink and non-injected, sclera clear OROPHARYNX:no exudate, no erythema and lips, buccal mucosa,  and tongue normal  NECK: supple, thyroid normal size, non-tender, without nodularity LYMPH:  no palpable lymphadenopathy in the cervical, axillary or inguinal LUNGS: clear to auscultation and percussion with normal breathing effort HEART: regular rate & rhythm and no murmurs and no lower extremity edema ABDOMEN:abdomen soft, non-tender and normal bowel sounds MUSCULOSKELETAL:no cyanosis of digits and no clubbing  NEURO: alert & oriented x 3 with fluent speech, no focal motor/sensory deficits EXTREMITIES: No lower extremity edema  LABORATORY DATA:  I have reviewed the data as listed   Chemistry      Component Value Date/Time   NA 137 11/25/2015 0904   K 4.4 11/25/2015 0904   CL 105 10/29/2015 0351   CO2 27 11/25/2015 0904   BUN 12.7 11/25/2015 0904   CREATININE 0.7 11/25/2015 0904      Component Value Date/Time   CALCIUM 9.9 11/25/2015 0904   ALKPHOS 121 11/25/2015 0904   AST 24 11/25/2015 0904   ALT 24 11/25/2015 0904   BILITOT 0.49 11/25/2015 0904       Lab Results  Component Value Date   WBC 7.9 11/25/2015   HGB 14.2 11/25/2015   HCT 42.6 11/25/2015   MCV 97.7 11/25/2015   PLT 319 11/25/2015   NEUTROABS 4.4 11/25/2015     ASSESSMENT & PLAN:  Breast cancer of upper-outer quadrant of left female breast ([place]) Left mastectomy 10/28/2015: IDC grade 3, 3 cm, lymphovascular invasion present, margins negative, 0/6 lymph nodes, ER 0%, PR 0%, HER-2 negative, Ki-67 60%, T2 N0 stage II a  Treatment plan: Adjuvant chemotherapy with dose dense Adriamycin and Cytoxan every 2 weeks 4 followed by weekly Taxol 12 No role of adjuvant radiation if the lymph nodes and margins are negative. No role for antiestrogen therapy since she is triple negative. ------------------------------------------------------------------------------------------------------------------------------------------------------------ Current treatment: Today's cycle 1 day 1 of dose dense Adriamycin and  Cytoxan. Antiemetics were reviewed Echocardiogram 10/15/2015: EF 60-65% Chemotherapy education had been completed Answered all of their questions.  Return to clinic in one week for toxicity check.    No orders of the defined types were placed in this encounter.  The patient has a good understanding of the overall plan. she agrees with it. she will call with any problems that may develop before the next visit here.   Rulon Eisenmenger, MD 11/25/15

## 2015-12-01 ENCOUNTER — Encounter: Payer: Self-pay | Admitting: Hematology and Oncology

## 2015-12-01 ENCOUNTER — Other Ambulatory Visit (HOSPITAL_BASED_OUTPATIENT_CLINIC_OR_DEPARTMENT_OTHER): Payer: BLUE CROSS/BLUE SHIELD

## 2015-12-01 ENCOUNTER — Ambulatory Visit (HOSPITAL_BASED_OUTPATIENT_CLINIC_OR_DEPARTMENT_OTHER): Payer: BLUE CROSS/BLUE SHIELD | Admitting: Hematology and Oncology

## 2015-12-01 DIAGNOSIS — Z171 Estrogen receptor negative status [ER-]: Principal | ICD-10-CM

## 2015-12-01 DIAGNOSIS — C50412 Malignant neoplasm of upper-outer quadrant of left female breast: Secondary | ICD-10-CM | POA: Diagnosis not present

## 2015-12-01 DIAGNOSIS — R530 Neoplastic (malignant) related fatigue: Secondary | ICD-10-CM | POA: Diagnosis not present

## 2015-12-01 DIAGNOSIS — R110 Nausea: Secondary | ICD-10-CM | POA: Diagnosis not present

## 2015-12-01 DIAGNOSIS — D701 Agranulocytosis secondary to cancer chemotherapy: Secondary | ICD-10-CM | POA: Diagnosis not present

## 2015-12-01 LAB — CBC WITH DIFFERENTIAL/PLATELET
BASO%: 2.6 % — ABNORMAL HIGH (ref 0.0–2.0)
Basophils Absolute: 0.1 10*3/uL (ref 0.0–0.1)
EOS ABS: 0.3 10*3/uL (ref 0.0–0.5)
EOS%: 13.1 % — ABNORMAL HIGH (ref 0.0–7.0)
HCT: 42 % (ref 34.8–46.6)
HGB: 14 g/dL (ref 11.6–15.9)
LYMPH%: 53.7 % — AB (ref 14.0–49.7)
MCH: 32 pg (ref 25.1–34.0)
MCHC: 33.4 g/dL (ref 31.5–36.0)
MCV: 95.9 fL (ref 79.5–101.0)
MONO#: 0.1 10*3/uL (ref 0.1–0.9)
MONO%: 6.9 % (ref 0.0–14.0)
NEUT%: 23.7 % — AB (ref 38.4–76.8)
NEUTROS ABS: 0.5 10*3/uL — AB (ref 1.5–6.5)
PLATELETS: 140 10*3/uL — AB (ref 145–400)
RBC: 4.38 10*6/uL (ref 3.70–5.45)
RDW: 12.2 % (ref 11.2–14.5)
WBC: 2.1 10*3/uL — ABNORMAL LOW (ref 3.9–10.3)
lymph#: 1.1 10*3/uL (ref 0.9–3.3)

## 2015-12-01 LAB — COMPREHENSIVE METABOLIC PANEL
ALT: 15 U/L (ref 0–55)
ANION GAP: 10 meq/L (ref 3–11)
AST: 13 U/L (ref 5–34)
Albumin: 3.9 g/dL (ref 3.5–5.0)
Alkaline Phosphatase: 130 U/L (ref 40–150)
BILIRUBIN TOTAL: 0.65 mg/dL (ref 0.20–1.20)
BUN: 12 mg/dL (ref 7.0–26.0)
CALCIUM: 9.6 mg/dL (ref 8.4–10.4)
CO2: 27 meq/L (ref 22–29)
Chloride: 97 mEq/L — ABNORMAL LOW (ref 98–109)
Creatinine: 0.7 mg/dL (ref 0.6–1.1)
Glucose: 102 mg/dl (ref 70–140)
Potassium: 4.2 mEq/L (ref 3.5–5.1)
Sodium: 134 mEq/L — ABNORMAL LOW (ref 136–145)
TOTAL PROTEIN: 6.9 g/dL (ref 6.4–8.3)

## 2015-12-01 MED ORDER — PROMETHAZINE HCL 25 MG PO TABS: 25.0000 mg | ORAL_TABLET | Freq: Four times a day (QID) | ORAL | 3 refills | Status: DC | PRN

## 2015-12-01 NOTE — Assessment & Plan Note (Signed)
Left mastectomy 10/28/2015: IDC grade 3, 3 cm, lymphovascular invasion present, margins negative, 0/6 lymph nodes, ER 0%, PR 0%, HER-2 negative, Ki-67 60%, T2 N0 stage II a  Treatment plan: Adjuvant chemotherapy with dose dense Adriamycin and Cytoxan every 2 weeks 4 followed by weekly Taxol 12 No role of adjuvant radiation if the lymph nodes and margins are negative. No role for antiestrogen therapy since she is triple negative. ------------------------------------------------------------------------------------------------------------------------------------------------------------ Current treatment: Today's cycle 1 day 8 of dose dense Adriamycin and Cytoxan. Echocardiogram 10/15/2015: EF 60-65% Labs have been reviewed thoroughly. Monitoring closely for chemotherapy toxicities  Chemotherapy toxicities:  Return to clinic in one week for Cycle 2.

## 2015-12-01 NOTE — Progress Notes (Signed)
Received critical [place] 0.5 from Diane in lab.  Pt in for NADIR check after 1st A/C.  Reviewed information with Dr. Lindi Adie.

## 2015-12-01 NOTE — Progress Notes (Signed)
Patient Care Team: Berkley Harvey, NP as PCP - General (Nurse Practitioner) Erroll Luna, MD as Consulting Physician (General Surgery) Nicholas Lose, MD as Consulting Physician (Hematology and Oncology) Kyung Rudd, MD as Consulting Physician (Radiation Oncology)  DIAGNOSIS: Breast cancer of upper-outer quadrant of left female breast [facility])   Staging form: Breast, AJCC 7th Edition   - Clinical stage from 09/29/2015: Stage IIA (T2, N0, M0) - Unsigned  SUMMARY OF ONCOLOGIC HISTORY:   Breast cancer of upper-outer quadrant of left female breast ([place] )   09/28/2000 Initial Biopsy    Left lumpectomy for stage I breast cancer that was ER/PR negative followed by adjuvant radiation      09/20/2015 Initial Diagnosis    Left breast mass 3 cm 1:00 position: IDC grade 2, ER/PR negative, Ki-67 60%, HER-2 negative, T2 N0 stage IIA clinical stage      10/28/2015 Surgery    Left mastectomy: IDC grade 3, 3 cm, lymphovascular invasion present, margins negative, 0/6 lymph nodes, ER 0%, PR 0%, HER-2 negative, Ki-67 60%, T2 N0 stage II a      11/25/2015 -  Chemotherapy    Adjuvant chemotherapy with Dose dense Adriamycin and Cytoxan 4 followed by Taxol 12        CHIEF COMPLIANT: cycle 1 day 8 dose dense Adriamycin and Cytoxan  INTERVAL HISTORY: Leah Mcdowell is a 60 year old with above-mentioned history of left breast cancer treated with mastectomy and is now on adjuvant chemotherapy. Today is cycle 1 day 8 of dose dense Adriamycin and Cytoxan. She had nausea after the chemotherapy and had to take Zofran and Compazine. She reports that Zofran causes severe headache and Compazine made her extremely hyper. She does not want to take either of these nausea medications. She also felt fatigued.  REVIEW OF SYSTEMS:   Constitutional: Denies fevers, chills or abnormal weight loss Eyes: Denies blurriness of vision Ears, nose, mouth, throat, and face: Denies mucositis or sore throat Respiratory: Denies cough,  dyspnea or wheezes Cardiovascular: Denies palpitation, chest discomfort Gastrointestinal:  nausea Skin: Denies abnormal skin rashes Lymphatics: Denies new lymphadenopathy or easy bruising Neurological:Denies numbness, tingling or new weaknesses Behavioral/Psych: Mood is stable, no new changes  Extremities: No lower extremity edema Breast:  denies any pain or lumps or nodules in either breasts All other systems were reviewed with the patient and are negative.  I have reviewed the past medical history, past surgical history, social history and family history with the patient and they are unchanged from previous note.  ALLERGIES:  is allergic to no known allergies.  MEDICATIONS:  Current Outpatient Prescriptions  Medication Sig Dispense Refill  . gabapentin (NEURONTIN) 300 MG capsule Take 300 mg by mouth 3 (three) times daily.    Marland Kitchen lidocaine-prilocaine (EMLA) cream Apply to affected area once 30 g 3  . LORazepam (ATIVAN) 0.5 MG tablet Take 1 tablet (0.5 mg total) by mouth every 6 (six) hours as needed (Nausea or vomiting). 30 tablet 0  . promethazine (PHENERGAN) 25 MG tablet Take 1 tablet (25 mg total) by mouth every 6 (six) hours as needed for nausea. 30 tablet 3   No current facility-administered medications for this visit.     PHYSICAL EXAMINATION: ECOG PERFORMANCE STATUS: 1 - Symptomatic but completely ambulatory  Vitals:   12/01/15 0944  BP: 131/68  Pulse: 89  Resp: 18  Temp: 98.3 F (36.8 C)   Filed Weights   12/01/15 0944  Weight: 103 lb 9.6 oz (47 kg)    GENERAL:alert, no  distress and comfortable SKIN: skin color, texture, turgor are normal, no rashes or significant lesions EYES: normal, Conjunctiva are pink and non-injected, sclera clear OROPHARYNX:no exudate, no erythema and lips, buccal mucosa, and tongue normal  NECK: supple, thyroid normal size, non-tender, without nodularity LYMPH:  no palpable lymphadenopathy in the cervical, axillary or inguinal LUNGS:  clear to auscultation and percussion with normal breathing effort HEART: regular rate & rhythm and no murmurs and no lower extremity edema ABDOMEN:abdomen soft, non-tender and normal bowel sounds MUSCULOSKELETAL:no cyanosis of digits and no clubbing  NEURO: alert & oriented x 3 with fluent speech, no focal motor/sensory deficits EXTREMITIES: No lower extremity edema  LABORATORY DATA:  I have reviewed the data as listed   Chemistry      Component Value Date/Time   NA 134 (L) 12/01/2015 0855   K 4.2 12/01/2015 0855   CL 105 10/29/2015 0351   CO2 27 12/01/2015 0855   BUN 12.0 12/01/2015 0855   CREATININE 0.7 12/01/2015 0855      Component Value Date/Time   CALCIUM 9.6 12/01/2015 0855   ALKPHOS 130 12/01/2015 0855   AST 13 12/01/2015 0855   ALT 15 12/01/2015 0855   BILITOT 0.65 12/01/2015 0855       Lab Results  Component Value Date   WBC 2.1 (L) 12/01/2015   HGB 14.0 12/01/2015   HCT 42.0 12/01/2015   MCV 95.9 12/01/2015   PLT 140 (L) 12/01/2015   NEUTROABS 0.5 (LL) 12/01/2015     ASSESSMENT & PLAN:  Breast cancer of upper-outer quadrant of left female breast ([place]) Left mastectomy 10/28/2015: IDC grade 3, 3 cm, lymphovascular invasion present, margins negative, 0/6 lymph nodes, ER 0%, PR 0%, HER-2 negative, Ki-67 60%, T2 N0 stage II a  Treatment plan: Adjuvant chemotherapy with dose dense Adriamycin and Cytoxan every 2 weeks 4 followed by weekly Taxol 12 No role of adjuvant radiation if the lymph nodes and margins are negative. No role for antiestrogen therapy since she is triple negative. ------------------------------------------------------------------------------------------------------------------------------------------------------------ Current treatment: Today's cycle 1 day 8 of dose dense Adriamycin and Cytoxan. Echocardiogram 10/15/2015: EF 60-65% Labs have been reviewed thoroughly. Monitoring closely for chemotherapy toxicities  Chemotherapy  toxicities: 1. Grade 3 neutropenia: We will reduce the dosage of cycle 2 of dose dense Adriamycin and Cytoxan 2. Nausea grade 2: I discontinued Zofran and Compazine and sent a prescription for Phenergan. 3. Fatigue grade 2  Return to clinic in one week for Cycle 2.   No orders of the defined types were placed in this encounter.  The patient has a good understanding of the overall plan. she agrees with it. she will call with any problems that may develop before the next visit here.   Rulon Eisenmenger, MD 12/01/15

## 2015-12-09 ENCOUNTER — Other Ambulatory Visit (HOSPITAL_BASED_OUTPATIENT_CLINIC_OR_DEPARTMENT_OTHER): Payer: BLUE CROSS/BLUE SHIELD

## 2015-12-09 ENCOUNTER — Ambulatory Visit: Payer: BLUE CROSS/BLUE SHIELD | Admitting: Hematology and Oncology

## 2015-12-09 ENCOUNTER — Encounter: Payer: Self-pay | Admitting: *Deleted

## 2015-12-09 ENCOUNTER — Ambulatory Visit (HOSPITAL_BASED_OUTPATIENT_CLINIC_OR_DEPARTMENT_OTHER): Payer: BLUE CROSS/BLUE SHIELD

## 2015-12-09 VITALS — BP 107/83 | HR 95 | Temp 98.2°F | Resp 17

## 2015-12-09 DIAGNOSIS — C50412 Malignant neoplasm of upper-outer quadrant of left female breast: Secondary | ICD-10-CM | POA: Diagnosis not present

## 2015-12-09 DIAGNOSIS — Z171 Estrogen receptor negative status [ER-]: Principal | ICD-10-CM

## 2015-12-09 DIAGNOSIS — Z5111 Encounter for antineoplastic chemotherapy: Secondary | ICD-10-CM | POA: Diagnosis not present

## 2015-12-09 DIAGNOSIS — D701 Agranulocytosis secondary to cancer chemotherapy: Secondary | ICD-10-CM | POA: Diagnosis not present

## 2015-12-09 LAB — COMPREHENSIVE METABOLIC PANEL
ALBUMIN: 3.8 g/dL (ref 3.5–5.0)
ALK PHOS: 149 U/L (ref 40–150)
ALT: 13 U/L (ref 0–55)
AST: 14 U/L (ref 5–34)
Anion Gap: 8 mEq/L (ref 3–11)
BUN: 10.5 mg/dL (ref 7.0–26.0)
CHLORIDE: 103 meq/L (ref 98–109)
CO2: 27 meq/L (ref 22–29)
Calcium: 9.9 mg/dL (ref 8.4–10.4)
Creatinine: 0.7 mg/dL (ref 0.6–1.1)
EGFR: >90 mL/min/{1.73_m2} (ref >90)
GLUCOSE: 106 mg/dL (ref 70–140)
POTASSIUM: 4.2 meq/L (ref 3.5–5.1)
SODIUM: 138 meq/L (ref 136–145)
Total Bilirubin: 0.28 mg/dL (ref 0.20–1.20)
Total Protein: 7.1 g/dL (ref 6.4–8.3)

## 2015-12-09 LAB — CBC WITH DIFFERENTIAL/PLATELET
BASO%: 0.6 % (ref 0.0–2.0)
BASOS ABS: 0.1 10*3/uL (ref 0.0–0.1)
EOS%: 0.5 % (ref 0.0–7.0)
Eosinophils Absolute: 0.1 10*3/uL (ref 0.0–0.5)
HCT: 41.5 % (ref 34.8–46.6)
HEMOGLOBIN: 14 g/dL (ref 11.6–15.9)
LYMPH%: 20.8 % (ref 14.0–49.7)
MCH: 32.4 pg (ref 25.1–34.0)
MCHC: 33.8 g/dL (ref 31.5–36.0)
MCV: 95.9 fL (ref 79.5–101.0)
MONO#: 1.2 10*3/uL — ABNORMAL HIGH (ref 0.1–0.9)
MONO%: 11.1 % (ref 0.0–14.0)
NEUT%: 67 % (ref 38.4–76.8)
NEUTROS ABS: 7.2 10*3/uL — AB (ref 1.5–6.5)
Platelets: 309 10*3/uL (ref 145–400)
RBC: 4.33 10*6/uL (ref 3.70–5.45)
RDW: 12.4 % (ref 11.2–14.5)
WBC: 10.7 10*3/uL — AB (ref 3.9–10.3)
lymph#: 2.2 10*3/uL (ref 0.9–3.3)

## 2015-12-09 MED ORDER — HEPARIN SOD (PORK) LOCK FLUSH 100 UNIT/ML IV SOLN
500.0000 [IU] | Freq: Once | INTRAVENOUS | Status: AC | PRN
  Administered 2015-12-09: 500 [IU]
  Filled 2015-12-09: qty 5

## 2015-12-09 MED ORDER — PALONOSETRON HCL INJECTION 0.25 MG/5ML
0.2500 mg | Freq: Once | INTRAVENOUS | Status: AC
Start: 2015-12-09 — End: 2015-12-09
  Administered 2015-12-09: 0.25 mg via INTRAVENOUS

## 2015-12-09 MED ORDER — DOXORUBICIN HCL CHEMO IV INJECTION 2 MG/ML
50.0000 mg/m2 | Freq: Once | INTRAVENOUS | Status: AC
  Administered 2015-12-09: 76 mg via INTRAVENOUS
  Filled 2015-12-09: qty 38

## 2015-12-09 MED ORDER — SODIUM CHLORIDE 0.9 % IV SOLN
500.0000 mg/m2 | Freq: Once | INTRAVENOUS | Status: AC
  Administered 2015-12-09: 760 mg via INTRAVENOUS
  Filled 2015-12-09: qty 38

## 2015-12-09 MED ORDER — SODIUM CHLORIDE 0.9 % IV SOLN
Freq: Once | INTRAVENOUS | Status: AC
  Administered 2015-12-09: 11:00:00 via INTRAVENOUS
  Filled 2015-12-09: qty 5

## 2015-12-09 MED ORDER — PALONOSETRON HCL INJECTION 0.25 MG/5ML
INTRAVENOUS | Status: AC
  Filled 2015-12-09: qty 5

## 2015-12-09 MED ORDER — SODIUM CHLORIDE 0.9% FLUSH
10.0000 mL | INTRAVENOUS | Status: DC | PRN
  Administered 2015-12-09: 10 mL
  Filled 2015-12-09: qty 10

## 2015-12-09 MED ORDER — SODIUM CHLORIDE 0.9 % IV SOLN
Freq: Once | INTRAVENOUS | Status: AC
  Administered 2015-12-09: 11:00:00 via INTRAVENOUS

## 2015-12-09 MED ORDER — PEGFILGRASTIM 6 MG/0.6ML ~~LOC~~ PSKT
6.0000 mg | PREFILLED_SYRINGE | Freq: Once | SUBCUTANEOUS | Status: AC
  Administered 2015-12-09: 6 mg via SUBCUTANEOUS
  Filled 2015-12-09: qty 0.6

## 2015-12-09 NOTE — Patient Instructions (Signed)
[facility] Discharge Instructions for Patients Receiving Chemotherapy  Today you received the following chemotherapy agents : Adriamycin,  Cytoxan.  To help prevent nausea and vomiting after your treatment, we encourage you to take your nausea medication as prescribed.   If you develop nausea and vomiting that is not controlled by your nausea medication, call the clinic.   BELOW ARE SYMPTOMS THAT SHOULD BE REPORTED IMMEDIATELY:  *FEVER GREATER THAN 100.5 F  *CHILLS WITH OR WITHOUT FEVER  NAUSEA AND VOMITING THAT IS NOT CONTROLLED WITH YOUR NAUSEA MEDICATION  *UNUSUAL SHORTNESS OF BREATH  *UNUSUAL BRUISING OR BLEEDING  TENDERNESS IN MOUTH AND THROAT WITH OR WITHOUT PRESENCE OF ULCERS  *URINARY PROBLEMS  *BOWEL PROBLEMS  UNUSUAL RASH Items with * indicate a potential emergency and should be followed up as soon as possible.  Feel free to call the clinic you have any questions or concerns. The clinic phone number is [phone].  Please show the CHEMO ALERT CARD at check-in to the Emergency Department and triage nurse.   

## 2015-12-09 NOTE — Progress Notes (Signed)
Spoke with patient in chemo today.  She has had some trouble with nausea and her medications.  Dr. Lindi Adie stopped the Zofran due to headaches and Compazine due to making her feel hyper.  She tried the phenergan but he made her extremely drowsy.  Dr. Lindi Adie saw patient today and recommended she try the Zofran again along with some Tylenol and take 1/2 phenergan as a back up.  Patient will try this and encouraged her to call with any needs or concerns.

## 2015-12-20 ENCOUNTER — Other Ambulatory Visit (HOSPITAL_COMMUNITY): Payer: Self-pay

## 2015-12-20 ENCOUNTER — Ambulatory Visit (HOSPITAL_COMMUNITY): Payer: BLUE CROSS/BLUE SHIELD

## 2015-12-20 ENCOUNTER — Ambulatory Visit (HOSPITAL_COMMUNITY)
Admission: RE | Admit: 2015-12-20 | Discharge: 2015-12-20 | Disposition: A | Discharge status: Home / Self Care | Payer: BLUE CROSS/BLUE SHIELD | Source: Ambulatory Visit | Attending: Cardiology | Admitting: Cardiology

## 2015-12-20 ENCOUNTER — Ambulatory Visit (HOSPITAL_BASED_OUTPATIENT_CLINIC_OR_DEPARTMENT_OTHER)
Admission: RE | Admit: 2015-12-20 | Discharge: 2015-12-20 | Disposition: A | Discharge status: Home / Self Care | Payer: BLUE CROSS/BLUE SHIELD | Source: Ambulatory Visit | Attending: Cardiology | Admitting: Cardiology

## 2015-12-20 VITALS — BP 108/68 | HR 95 | Wt 106.8 lb

## 2015-12-20 DIAGNOSIS — I340 Nonrheumatic mitral (valve) insufficiency: Secondary | ICD-10-CM | POA: Insufficient documentation

## 2015-12-20 DIAGNOSIS — Z87891 Personal history of nicotine dependence: Secondary | ICD-10-CM | POA: Diagnosis not present

## 2015-12-20 DIAGNOSIS — C50412 Malignant neoplasm of upper-outer quadrant of left female breast: Secondary | ICD-10-CM

## 2015-12-20 DIAGNOSIS — C50912 Malignant neoplasm of unspecified site of left female breast: Secondary | ICD-10-CM | POA: Diagnosis not present

## 2015-12-20 DIAGNOSIS — Z9012 Acquired absence of left breast and nipple: Secondary | ICD-10-CM | POA: Diagnosis not present

## 2015-12-20 DIAGNOSIS — Z923 Personal history of irradiation: Secondary | ICD-10-CM | POA: Diagnosis not present

## 2015-12-20 DIAGNOSIS — Z171 Estrogen receptor negative status [ER-]: Secondary | ICD-10-CM

## 2015-12-20 NOTE — Progress Notes (Signed)
Oncology: Dr. Lindi Adie  60 yo with history of breast cancer presents for cardio-oncology evaluation.  Left breast cancer first found 8/02 and treated with lumpectomy and radiation.  She was found to have a left breast recurrence in 7/17, ER-/PR-/HER2-.  She had left mastectomy in 9/17.  She started Adriamycin/cytoxin in 10/17, plan for q2 wks x 4 cycles then weekly Taxol.  She has no known cardiac problems.  She is a smoker but is trying to quit.  No exertional dyspnea or chest pain.  PMH: 1. H/o Guillain-Barre syndrome.  2. Active smoker 3. Breast cancer: Left breast cancer first found 8/02 and treated with lumpectomy and radiation.  She was found to have a left breast recurrence in 7/17, ER-/PR-/HER2-.  She had left mastectomy in 9/17.  Plan for Adriamycin/cytoxin after surgery q2 wks x 4 cycles then weekly Taxol. - Echo (8/17): EF 60-65%, grade II diastolic dysfunction, GLS -21.1%.  - Echo (10/17): EF 98-92%, normal diastolic function, lateral s' 14.8 cm/sec, GLS -21%.   Social History   Social History  . Marital status: Married    Spouse name: N/A  . Number of children: N/A  . Years of education: N/A   Occupational History  . Not on file.   Social History Main Topics  . Smoking status: Former Smoker    Packs/day: 0.00    Types: Cigarettes    Quit date: 10/22/2015  . Smokeless tobacco: Never Used  . Alcohol use Yes     Comment: moderate use  . Drug use: No  . Sexual activity: Not on file   Other Topics Concern  . Not on file   Social History Narrative  . No narrative on file   Family History  Problem Relation Age of Onset  . Breast cancer Paternal Aunt    ROS: All systems reviewed and negative except as per HPI.   Current Outpatient Prescriptions  Medication Sig Dispense Refill  . lidocaine-prilocaine (EMLA) cream Apply to affected area once 30 g 3  . LORazepam (ATIVAN) 0.5 MG tablet Take 1 tablet (0.5 mg total) by mouth every 6 (six) hours as needed (Nausea or  vomiting). 30 tablet 0  . promethazine (PHENERGAN) 25 MG tablet Take 1 tablet (25 mg total) by mouth every 6 (six) hours as needed for nausea. 30 tablet 3  . gabapentin (NEURONTIN) 300 MG capsule Take 300 mg by mouth 3 (three) times daily.     No current facility-administered medications for this encounter.    BP 108/68 (BP Location: Right Arm, Patient Position: Sitting, Cuff Size: Normal)   Pulse 95   Wt 106 lb 12.8 oz (48.4 kg)   SpO2 90%   BMI 16.24 kg/m  General: NAD Neck: No JVD, no thyromegaly or thyroid nodule.  Lungs: Clear to auscultation bilaterally with normal respiratory effort. CV: Nondisplaced PMI.  Heart regular S1/S2, no S3/S4, no murmur.  No peripheral edema.  No carotid bruit.  Normal pedal pulses.  Abdomen: Soft, nontender, no hepatosplenomegaly, no distention.  Skin: Intact without lesions or rashes.  Neurologic: Alert and oriented x 3.  Psych: Normal affect. Extremities: No clubbing or cyanosis.  HEENT: Normal.   Assessment/Plan: Breast cancer: She will be getting Adriamycin for about 8 weeks, started this month.  I reviewed today's echo, her EF is normal and strain pattern is normal.  I will repeat an echo in 3 months after she has completed Adriamycin.  Will then do an echo 6 months after that to catch any late effects  of Adriamycin.  Loralie Champagne 12/20/2015

## 2015-12-20 NOTE — Patient Instructions (Signed)
Follow up 3 months with echocardiogram and appointment with Dr. McLean. 

## 2015-12-20 NOTE — Progress Notes (Signed)
  Echocardiogram 2D Echocardiogram has been performed.  Tresa Res 12/20/2015, 9:44 AM

## 2015-12-22 NOTE — Assessment & Plan Note (Signed)
Left mastectomy 10/28/2015: IDC grade 3, 3 cm, lymphovascular invasion present, margins negative, 0/6 lymph nodes, ER 0%, PR 0%, HER-2 negative, Ki-67 60%, T2 N0 stage II a  Treatment plan: Adjuvant chemotherapy with dose dense Adriamycin and Cytoxan every 2 weeks 4 followed by weekly Taxol 12 No role of adjuvant radiation if the lymph nodes and margins are negative. No role for antiestrogen therapy since she is triple negative. ------------------------------------------------------------------------------------------------------------------------------------------------------------ Current treatment: Today's cycle 2 day 1 of dose dense Adriamycin and Cytoxan. Echocardiogram 10/15/2015: EF 60-65% Labs have been reviewed thoroughly. Monitoring closely for chemotherapy toxicities  Chemotherapy toxicities: 1. Grade 3 neutropenia: We will reduce the dosage of cycle 2 of dose dense Adriamycin and Cytoxan 2. Nausea grade 2: I discontinued Zofran and Compazine and sent a prescription for Phenergan. 3. Fatigue grade 2  Return to clinic in one week for Cycle 3

## 2015-12-23 ENCOUNTER — Encounter: Payer: Self-pay | Admitting: Hematology and Oncology

## 2015-12-23 ENCOUNTER — Ambulatory Visit (HOSPITAL_BASED_OUTPATIENT_CLINIC_OR_DEPARTMENT_OTHER): Payer: BLUE CROSS/BLUE SHIELD

## 2015-12-23 ENCOUNTER — Ambulatory Visit (HOSPITAL_BASED_OUTPATIENT_CLINIC_OR_DEPARTMENT_OTHER): Payer: BLUE CROSS/BLUE SHIELD | Admitting: Hematology and Oncology

## 2015-12-23 ENCOUNTER — Encounter: Payer: Self-pay | Admitting: *Deleted

## 2015-12-23 ENCOUNTER — Other Ambulatory Visit (HOSPITAL_BASED_OUTPATIENT_CLINIC_OR_DEPARTMENT_OTHER): Payer: BLUE CROSS/BLUE SHIELD

## 2015-12-23 DIAGNOSIS — C50412 Malignant neoplasm of upper-outer quadrant of left female breast: Secondary | ICD-10-CM | POA: Diagnosis not present

## 2015-12-23 DIAGNOSIS — L658 Other specified nonscarring hair loss: Secondary | ICD-10-CM | POA: Diagnosis not present

## 2015-12-23 DIAGNOSIS — Z5111 Encounter for antineoplastic chemotherapy: Secondary | ICD-10-CM | POA: Diagnosis not present

## 2015-12-23 DIAGNOSIS — R630 Anorexia: Secondary | ICD-10-CM | POA: Diagnosis not present

## 2015-12-23 DIAGNOSIS — R530 Neoplastic (malignant) related fatigue: Secondary | ICD-10-CM | POA: Diagnosis not present

## 2015-12-23 DIAGNOSIS — Z171 Estrogen receptor negative status [ER-]: Principal | ICD-10-CM

## 2015-12-23 LAB — CBC WITH DIFFERENTIAL/PLATELET
BASO%: 0.6 % (ref 0.0–2.0)
Basophils Absolute: 0.1 10*3/uL (ref 0.0–0.1)
EOS%: 0.4 % (ref 0.0–7.0)
Eosinophils Absolute: 0.1 10*3/uL (ref 0.0–0.5)
HCT: 37.9 % (ref 34.8–46.6)
HGB: 12.7 g/dL (ref 11.6–15.9)
LYMPH%: 15.9 % (ref 14.0–49.7)
MCH: 32.2 pg (ref 25.1–34.0)
MCHC: 33.4 g/dL (ref 31.5–36.0)
MCV: 96.4 fL (ref 79.5–101.0)
MONO#: 1.5 10*3/uL — AB (ref 0.1–0.9)
MONO%: 10.6 % (ref 0.0–14.0)
NEUT%: 72.5 % (ref 38.4–76.8)
NEUTROS ABS: 10.5 10*3/uL — AB (ref 1.5–6.5)
PLATELETS: 311 10*3/uL (ref 145–400)
RBC: 3.93 10*6/uL (ref 3.70–5.45)
RDW: 12.8 % (ref 11.2–14.5)
WBC: 14.5 10*3/uL — AB (ref 3.9–10.3)
lymph#: 2.3 10*3/uL (ref 0.9–3.3)

## 2015-12-23 LAB — COMPREHENSIVE METABOLIC PANEL
ALT: 14 U/L (ref 0–55)
ANION GAP: 7 meq/L (ref 3–11)
AST: 15 U/L (ref 5–34)
Albumin: 3.7 g/dL (ref 3.5–5.0)
Alkaline Phosphatase: 141 U/L (ref 40–150)
BUN: 8.5 mg/dL (ref 7.0–26.0)
CHLORIDE: 104 meq/L (ref 98–109)
CO2: 27 meq/L (ref 22–29)
CREATININE: 0.7 mg/dL (ref 0.6–1.1)
Calcium: 9.2 mg/dL (ref 8.4–10.4)
EGFR: >90 mL/min/{1.73_m2} (ref >90)
GLUCOSE: 86 mg/dL (ref 70–140)
Potassium: 4 mEq/L (ref 3.5–5.1)
SODIUM: 138 meq/L (ref 136–145)
TOTAL PROTEIN: 6.5 g/dL (ref 6.4–8.3)

## 2015-12-23 MED ORDER — SODIUM CHLORIDE 0.9 % IV SOLN
500.0000 mg/m2 | Freq: Once | INTRAVENOUS | Status: AC
  Administered 2015-12-23: 760 mg via INTRAVENOUS
  Filled 2015-12-23: qty 38

## 2015-12-23 MED ORDER — PEGFILGRASTIM 6 MG/0.6ML ~~LOC~~ PSKT
6.0000 mg | PREFILLED_SYRINGE | Freq: Once | SUBCUTANEOUS | Status: AC
  Administered 2015-12-23: 6 mg via SUBCUTANEOUS
  Filled 2015-12-23: qty 0.6

## 2015-12-23 MED ORDER — DOXORUBICIN HCL CHEMO IV INJECTION 2 MG/ML
50.0000 mg/m2 | Freq: Once | INTRAVENOUS | Status: AC
  Administered 2015-12-23: 76 mg via INTRAVENOUS
  Filled 2015-12-23: qty 38

## 2015-12-23 MED ORDER — SODIUM CHLORIDE 0.9% FLUSH
10.0000 mL | INTRAVENOUS | Status: DC | PRN
  Administered 2015-12-23: 10 mL
  Filled 2015-12-23: qty 10

## 2015-12-23 MED ORDER — PALONOSETRON HCL INJECTION 0.25 MG/5ML
0.2500 mg | Freq: Once | INTRAVENOUS | Status: AC
  Administered 2015-12-23: 0.25 mg via INTRAVENOUS

## 2015-12-23 MED ORDER — HEPARIN SOD (PORK) LOCK FLUSH 100 UNIT/ML IV SOLN
500.0000 [IU] | Freq: Once | INTRAVENOUS | Status: AC | PRN
  Administered 2015-12-23: 500 [IU]
  Filled 2015-12-23: qty 5

## 2015-12-23 MED ORDER — PALONOSETRON HCL INJECTION 0.25 MG/5ML
INTRAVENOUS | Status: AC
  Filled 2015-12-23: qty 5

## 2015-12-23 MED ORDER — SODIUM CHLORIDE 0.9 % IV SOLN
Freq: Once | INTRAVENOUS | Status: AC
  Administered 2015-12-23: 11:00:00 via INTRAVENOUS
  Filled 2015-12-23: qty 5

## 2015-12-23 MED ORDER — SODIUM CHLORIDE 0.9 % IV SOLN
Freq: Once | INTRAVENOUS | Status: AC
  Administered 2015-12-23: 11:00:00 via INTRAVENOUS

## 2015-12-23 NOTE — Patient Instructions (Signed)
[facility] Discharge Instructions for Patients Receiving Chemotherapy  Today you received the following chemotherapy agents : Adriamycin,  Cytoxan.  To help prevent nausea and vomiting after your treatment, we encourage you to take your nausea medication as prescribed.   If you develop nausea and vomiting that is not controlled by your nausea medication, call the clinic.   BELOW ARE SYMPTOMS THAT SHOULD BE REPORTED IMMEDIATELY:  *FEVER GREATER THAN 100.5 F  *CHILLS WITH OR WITHOUT FEVER  NAUSEA AND VOMITING THAT IS NOT CONTROLLED WITH YOUR NAUSEA MEDICATION  *UNUSUAL SHORTNESS OF BREATH  *UNUSUAL BRUISING OR BLEEDING  TENDERNESS IN MOUTH AND THROAT WITH OR WITHOUT PRESENCE OF ULCERS  *URINARY PROBLEMS  *BOWEL PROBLEMS  UNUSUAL RASH Items with * indicate a potential emergency and should be followed up as soon as possible.  Feel free to call the clinic you have any questions or concerns. The clinic phone number is [phone].  Please show the CHEMO ALERT CARD at check-in to the Emergency Department and triage nurse.   

## 2015-12-23 NOTE — Progress Notes (Signed)
Patient Care Team: Berkley Harvey, NP as PCP - General (Nurse Practitioner) Erroll Luna, MD as Consulting Physician (General Surgery) Nicholas Lose, MD as Consulting Physician (Hematology and Oncology) Kyung Rudd, MD as Consulting Physician (Radiation Oncology)  DIAGNOSIS:  Encounter Diagnosis  Name Primary?  . Malignant neoplasm of upper-outer quadrant of left breast in female, estrogen receptor negative ([place])     SUMMARY OF ONCOLOGIC HISTORY:   Breast cancer of upper-outer quadrant of left female breast ([place])   09/28/2000 Initial Biopsy    Left lumpectomy for stage I breast cancer that was ER/PR negative followed by adjuvant radiation      09/20/2015 Initial Diagnosis    Left breast mass 3 cm 1:00 position: IDC grade 2, ER/PR negative, Ki-67 60%, HER-2 negative, T2 N0 stage IIA clinical stage      10/28/2015 Surgery    Left mastectomy: IDC grade 3, 3 cm, lymphovascular invasion present, margins negative, 0/6 lymph nodes, ER 0%, PR 0%, HER-2 negative, Ki-67 60%, T2 N0 stage II a      11/25/2015 -  Chemotherapy    Adjuvant chemotherapy with Dose dense Adriamycin and Cytoxan 4 followed by Taxol 12        CHIEF COMPLIANT: Cycle 3 dose dense Adriamycin and Cytoxan  INTERVAL HISTORY: Leah Mcdowell is a 60 year old with above-mentioned history of left breast cancer treated with mastectomy and is now on adjuvant chemotherapy. Today is cycle 3 of dose dense Adriamycin and Cytoxan. She tolerated cycle 2 extremely well. We reduced the dosage for cycle 2 and this appears to have worked. She does not have any nausea vomiting. She is now tolerating Zofran. Initially we thought she was getting headaches to Zofran but the headaches have subsided. She does feel extremely fatigued for 3-4 days after chemotherapy. Energy levels returned back and she goes back to work as well.  REVIEW OF SYSTEMS:   Constitutional: Denies fevers, chills or abnormal weight loss Eyes: Denies blurriness of  vision Ears, nose, mouth, throat, and face: Denies mucositis or sore throat Respiratory: Denies cough, dyspnea or wheezes Cardiovascular: Denies palpitation, chest discomfort Gastrointestinal:  Denies nausea, heartburn or change in bowel habits Skin: Denies abnormal skin rashes Lymphatics: Denies new lymphadenopathy or easy bruising Neurological:Denies numbness, tingling or new weaknesses Behavioral/Psych: Mood is stable, no new changes  Extremities: No lower extremity edema Breast:  denies any pain or lumps or nodules in either breasts All other systems were reviewed with the patient and are negative.  I have reviewed the past medical history, past surgical history, social history and family history with the patient and they are unchanged from previous note.  ALLERGIES:  is allergic to no known allergies.  MEDICATIONS:  Current Outpatient Prescriptions  Medication Sig Dispense Refill  . gabapentin (NEURONTIN) 300 MG capsule Take 300 mg by mouth 3 (three) times daily.    Marland Kitchen lidocaine-prilocaine (EMLA) cream Apply to affected area once 30 g 3  . LORazepam (ATIVAN) 0.5 MG tablet Take 1 tablet (0.5 mg total) by mouth every 6 (six) hours as needed (Nausea or vomiting). 30 tablet 0  . promethazine (PHENERGAN) 25 MG tablet Take 1 tablet (25 mg total) by mouth every 6 (six) hours as needed for nausea. 30 tablet 3   No current facility-administered medications for this visit.     PHYSICAL EXAMINATION: ECOG PERFORMANCE STATUS: 1 - Symptomatic but completely ambulatory  Vitals:   12/23/15 1007  BP: 111/73  Pulse: 76  Resp: 17  Temp: 98 F (36.7  C)   Filed Weights   12/23/15 1007  Weight: 105 lb (47.6 kg)    GENERAL:alert, no distress and comfortable SKIN: skin color, texture, turgor are normal, no rashes or significant lesions EYES: normal, Conjunctiva are pink and non-injected, sclera clear OROPHARYNX:no exudate, no erythema and lips, buccal mucosa, and tongue normal  NECK:  supple, thyroid normal size, non-tender, without nodularity LYMPH:  no palpable lymphadenopathy in the cervical, axillary or inguinal LUNGS: clear to auscultation and percussion with normal breathing effort HEART: regular rate & rhythm and no murmurs and no lower extremity edema ABDOMEN:abdomen soft, non-tender and normal bowel sounds MUSCULOSKELETAL:no cyanosis of digits and no clubbing  NEURO: alert & oriented x 3 with fluent speech, no focal motor/sensory deficits EXTREMITIES: No lower extremity edema  LABORATORY DATA:  I have reviewed the data as listed   Chemistry      Component Value Date/Time   NA 138 12/09/2015 1000   K 4.2 12/09/2015 1000   CL 105 10/29/2015 0351   CO2 27 12/09/2015 1000   BUN 10.5 12/09/2015 1000   CREATININE 0.7 12/09/2015 1000      Component Value Date/Time   CALCIUM 9.9 12/09/2015 1000   ALKPHOS 149 12/09/2015 1000   AST 14 12/09/2015 1000   ALT 13 12/09/2015 1000   BILITOT 0.28 12/09/2015 1000       Lab Results  Component Value Date   WBC 14.5 (H) 12/23/2015   HGB 12.7 12/23/2015   HCT 37.9 12/23/2015   MCV 96.4 12/23/2015   PLT 311 12/23/2015   NEUTROABS 10.5 (H) 12/23/2015     ASSESSMENT & PLAN:  Breast cancer of upper-outer quadrant of left female breast ([place]) Left mastectomy 10/28/2015: IDC grade 3, 3 cm, lymphovascular invasion present, margins negative, 0/6 lymph nodes, ER 0%, PR 0%, HER-2 negative, Ki-67 60%, T2 N0 stage II a  Treatment plan: Adjuvant chemotherapy with dose dense Adriamycin and Cytoxan every 2 weeks 4 followed by weekly Taxol 12 No role of adjuvant radiation if the lymph nodes and margins are negative. No role for antiestrogen therapy since she is triple negative. ------------------------------------------------------------------------------------------------------------------------------------------------------------ Current treatment: Today's cycle 3 day 1 of dose dense Adriamycin and Cytoxan. Echocardiogram  10/15/2015: EF 60-65% Labs have been reviewed thoroughly. Monitoring closely for chemotherapy toxicities  Chemotherapy toxicities: 1. Grade 3 neutropenia: We reduced the dosage of cycle 2 of dose dense Adriamycin and Cytoxan 2. Nausea grade 2: She is tolerating Zofran better and has not required to much of it. 3. Fatigue grade 2: From day 3 today 5 profound fatigue. Gets better afterwards 4. Alopecia 5. Loss of taste and appetite: Patient needing 6-7 small meals every day  Return to clinic in 2 weeks for Cycle 4  I will see her back for the first cycle of Taxol  No orders of the defined types were placed in this encounter.  The patient has a good understanding of the overall plan. she agrees with it. she will call with any problems that may develop before the next visit here.   Rulon Eisenmenger, MD 12/23/15

## 2016-01-04 ENCOUNTER — Telehealth: Payer: Self-pay

## 2016-01-04 NOTE — Telephone Encounter (Signed)
Called pt regarding telehealth message that was received today. Pt is currently at work but husband answered the phone. PT husband states " she was in so much mouth pain Saturday with burning, swelling and mouth sores. We called after hrs line and was told to try and gargle with warm water, baking soda and also oral rinsing with biotin." Pt husband states that she has been feeling much better since yesterday and symptoms have completely resolved". Pt husband states that she is at work today and is eating, drinking, and brushing her teeth without any problems. Told pt husband that they can call us if they need more management of her symptoms and if not, she will be following up with Dr. Lindi Adie on 11/30. Pt husband verbalized understanding and has no immediate concerns at this time.

## 2016-01-06 ENCOUNTER — Encounter: Payer: Self-pay | Admitting: *Deleted

## 2016-01-06 ENCOUNTER — Ambulatory Visit: Payer: BLUE CROSS/BLUE SHIELD | Admitting: Hematology and Oncology

## 2016-01-06 ENCOUNTER — Ambulatory Visit (HOSPITAL_BASED_OUTPATIENT_CLINIC_OR_DEPARTMENT_OTHER): Payer: BLUE CROSS/BLUE SHIELD

## 2016-01-06 ENCOUNTER — Other Ambulatory Visit (HOSPITAL_BASED_OUTPATIENT_CLINIC_OR_DEPARTMENT_OTHER): Payer: BLUE CROSS/BLUE SHIELD

## 2016-01-06 VITALS — BP 109/68 | HR 87 | Temp 98.3°F | Resp 17

## 2016-01-06 DIAGNOSIS — Z171 Estrogen receptor negative status [ER-]: Principal | ICD-10-CM

## 2016-01-06 DIAGNOSIS — C50412 Malignant neoplasm of upper-outer quadrant of left female breast: Secondary | ICD-10-CM | POA: Diagnosis not present

## 2016-01-06 LAB — CBC WITH DIFFERENTIAL/PLATELET
BASO%: 2.1 % — ABNORMAL HIGH (ref 0.0–2.0)
BASOS ABS: 0.2 10*3/uL — AB (ref 0.0–0.1)
EOS%: 0.5 % (ref 0.0–7.0)
Eosinophils Absolute: 0.1 10*3/uL (ref 0.0–0.5)
HCT: 34.6 % — ABNORMAL LOW (ref 34.8–46.6)
HGB: 11.9 g/dL (ref 11.6–15.9)
LYMPH%: 19.5 % (ref 14.0–49.7)
MCH: 32.7 pg (ref 25.1–34.0)
MCHC: 34.4 g/dL (ref 31.5–36.0)
MCV: 95.1 fL (ref 79.5–101.0)
MONO#: 1.1 10*3/uL — ABNORMAL HIGH (ref 0.1–0.9)
MONO%: 11.7 % (ref 0.0–14.0)
NEUT#: 6.2 10*3/uL (ref 1.5–6.5)
NEUT%: 66.2 % (ref 38.4–76.8)
Platelets: 268 10*3/uL (ref 145–400)
RBC: 3.64 10*6/uL — ABNORMAL LOW (ref 3.70–5.45)
RDW: 14.9 % — ABNORMAL HIGH (ref 11.2–14.5)
WBC: 9.3 10*3/uL (ref 3.9–10.3)
lymph#: 1.8 10*3/uL (ref 0.9–3.3)

## 2016-01-06 LAB — COMPREHENSIVE METABOLIC PANEL
ALT: 15 U/L (ref 0–55)
AST: 15 U/L (ref 5–34)
Albumin: 3.8 g/dL (ref 3.5–5.0)
Alkaline Phosphatase: 117 U/L (ref 40–150)
Anion Gap: 9 mEq/L (ref 3–11)
BUN: 12.6 mg/dL (ref 7.0–26.0)
CALCIUM: 9.5 mg/dL (ref 8.4–10.4)
CHLORIDE: 103 meq/L (ref 98–109)
CO2: 27 mEq/L (ref 22–29)
Creatinine: 0.7 mg/dL (ref 0.6–1.1)
EGFR: >90 mL/min/{1.73_m2} (ref >90)
GLUCOSE: 96 mg/dL (ref 70–140)
POTASSIUM: 4.1 meq/L (ref 3.5–5.1)
SODIUM: 138 meq/L (ref 136–145)
Total Bilirubin: 0.26 mg/dL (ref 0.20–1.20)
Total Protein: 6.6 g/dL (ref 6.4–8.3)

## 2016-01-06 MED ORDER — SODIUM CHLORIDE 0.9 % IV SOLN
Freq: Once | INTRAVENOUS | Status: AC
  Administered 2016-01-06: 11:00:00 via INTRAVENOUS
  Filled 2016-01-06: qty 5

## 2016-01-06 MED ORDER — SODIUM CHLORIDE 0.9% FLUSH
10.0000 mL | INTRAVENOUS | Status: DC | PRN
  Administered 2016-01-06: 10 mL
  Filled 2016-01-06: qty 10

## 2016-01-06 MED ORDER — DOXORUBICIN HCL CHEMO IV INJECTION 2 MG/ML
50.0000 mg/m2 | Freq: Once | INTRAVENOUS | Status: AC
  Administered 2016-01-06: 76 mg via INTRAVENOUS
  Filled 2016-01-06: qty 38

## 2016-01-06 MED ORDER — PEGFILGRASTIM 6 MG/0.6ML ~~LOC~~ PSKT
6.0000 mg | PREFILLED_SYRINGE | Freq: Once | SUBCUTANEOUS | Status: AC
  Administered 2016-01-06: 6 mg via SUBCUTANEOUS
  Filled 2016-01-06: qty 0.6

## 2016-01-06 MED ORDER — PALONOSETRON HCL INJECTION 0.25 MG/5ML
INTRAVENOUS | Status: AC
  Filled 2016-01-06: qty 5

## 2016-01-06 MED ORDER — HEPARIN SOD (PORK) LOCK FLUSH 100 UNIT/ML IV SOLN
500.0000 [IU] | Freq: Once | INTRAVENOUS | Status: AC | PRN
  Administered 2016-01-06: 500 [IU]
  Filled 2016-01-06: qty 5

## 2016-01-06 MED ORDER — SODIUM CHLORIDE 0.9 % IV SOLN
Freq: Once | INTRAVENOUS | Status: AC
  Administered 2016-01-06: 11:00:00 via INTRAVENOUS

## 2016-01-06 MED ORDER — PALONOSETRON HCL INJECTION 0.25 MG/5ML
0.2500 mg | Freq: Once | INTRAVENOUS | Status: AC
  Administered 2016-01-06: 0.25 mg via INTRAVENOUS

## 2016-01-06 MED ORDER — SODIUM CHLORIDE 0.9 % IV SOLN
500.0000 mg/m2 | Freq: Once | INTRAVENOUS | Status: AC
  Administered 2016-01-06: 760 mg via INTRAVENOUS
  Filled 2016-01-06: qty 38

## 2016-01-06 NOTE — Patient Instructions (Signed)
[facility] Discharge Instructions for Patients Receiving Chemotherapy  Today you received the following chemotherapy agents : Adriamycin,  Cytoxan.  To help prevent nausea and vomiting after your treatment, we encourage you to take your nausea medication as prescribed.   If you develop nausea and vomiting that is not controlled by your nausea medication, call the clinic.   BELOW ARE SYMPTOMS THAT SHOULD BE REPORTED IMMEDIATELY:  *FEVER GREATER THAN 100.5 F  *CHILLS WITH OR WITHOUT FEVER  NAUSEA AND VOMITING THAT IS NOT CONTROLLED WITH YOUR NAUSEA MEDICATION  *UNUSUAL SHORTNESS OF BREATH  *UNUSUAL BRUISING OR BLEEDING  TENDERNESS IN MOUTH AND THROAT WITH OR WITHOUT PRESENCE OF ULCERS  *URINARY PROBLEMS  *BOWEL PROBLEMS  UNUSUAL RASH Items with * indicate a potential emergency and should be followed up as soon as possible.  Feel free to call the clinic you have any questions or concerns. The clinic phone number is [phone].  Please show the CHEMO ALERT CARD at check-in to the Emergency Department and triage nurse.   

## 2016-01-19 ENCOUNTER — Other Ambulatory Visit: Payer: Self-pay

## 2016-01-19 DIAGNOSIS — Z171 Estrogen receptor negative status [ER-]: Principal | ICD-10-CM

## 2016-01-19 DIAGNOSIS — C50412 Malignant neoplasm of upper-outer quadrant of left female breast: Secondary | ICD-10-CM

## 2016-01-19 NOTE — Assessment & Plan Note (Signed)
Left mastectomy 10/28/2015: IDC grade 3, 3 cm, lymphovascular invasion present, margins negative, 0/6 lymph nodes, ER 0%, PR 0%, HER-2 negative, Ki-67 60%, T2 N0 stage II a  Treatment plan: Adjuvant chemotherapy with dose dense Adriamycin and Cytoxan every 2 weeks 4 followed by weekly Taxol 12 No role of adjuvant radiation if the lymph nodes and margins are negative. No role for antiestrogen therapy since she is triple negative. ------------------------------------------------------------------------------------------------------------------------------------------------------------ Current treatment: Today's cycle 1 Taxol. Echocardiogram 10/15/2015: EF 60-65% Labs have been reviewed thoroughly. Monitoring closely for chemotherapy toxicities  Chemotherapy toxicities: 1. Grade 3 neutropenia: We reduced the dosage of cycle 2 of dose dense Adriamycin and Cytoxan 2. Nausea grade 2: She is tolerating Zofran better and has not required to much of it. 3. Fatigue grade 2: From day 3 today 5 profound fatigue. Gets better afterwards 4. Alopecia 5. Loss of taste and appetite: Patient needing 6-7 small meals every day  Return to clinic in 1 weeks for Cycle 2 Taxol

## 2016-01-20 ENCOUNTER — Other Ambulatory Visit (HOSPITAL_BASED_OUTPATIENT_CLINIC_OR_DEPARTMENT_OTHER): Payer: BLUE CROSS/BLUE SHIELD

## 2016-01-20 ENCOUNTER — Ambulatory Visit (HOSPITAL_BASED_OUTPATIENT_CLINIC_OR_DEPARTMENT_OTHER): Payer: BLUE CROSS/BLUE SHIELD | Admitting: Hematology and Oncology

## 2016-01-20 ENCOUNTER — Ambulatory Visit (HOSPITAL_BASED_OUTPATIENT_CLINIC_OR_DEPARTMENT_OTHER): Payer: BLUE CROSS/BLUE SHIELD

## 2016-01-20 ENCOUNTER — Other Ambulatory Visit: Payer: Self-pay | Admitting: Hematology and Oncology

## 2016-01-20 ENCOUNTER — Encounter: Payer: Self-pay | Admitting: Hematology and Oncology

## 2016-01-20 VITALS — BP 102/63 | HR 97 | Temp 98.2°F | Resp 16

## 2016-01-20 DIAGNOSIS — C50412 Malignant neoplasm of upper-outer quadrant of left female breast: Secondary | ICD-10-CM

## 2016-01-20 DIAGNOSIS — R630 Anorexia: Secondary | ICD-10-CM

## 2016-01-20 DIAGNOSIS — Z5111 Encounter for antineoplastic chemotherapy: Secondary | ICD-10-CM | POA: Diagnosis not present

## 2016-01-20 DIAGNOSIS — R110 Nausea: Secondary | ICD-10-CM

## 2016-01-20 DIAGNOSIS — L658 Other specified nonscarring hair loss: Secondary | ICD-10-CM | POA: Diagnosis not present

## 2016-01-20 DIAGNOSIS — R530 Neoplastic (malignant) related fatigue: Secondary | ICD-10-CM

## 2016-01-20 DIAGNOSIS — Z171 Estrogen receptor negative status [ER-]: Principal | ICD-10-CM

## 2016-01-20 LAB — CBC WITH DIFFERENTIAL/PLATELET
BASO%: 1.5 % (ref 0.0–2.0)
Basophils Absolute: 0.2 10*3/uL — ABNORMAL HIGH (ref 0.0–0.1)
EOS%: 1 % (ref 0.0–7.0)
Eosinophils Absolute: 0.1 10*3/uL (ref 0.0–0.5)
HCT: 33.7 % — ABNORMAL LOW (ref 34.8–46.6)
HGB: 11.6 g/dL (ref 11.6–15.9)
LYMPH%: 11.6 % — ABNORMAL LOW (ref 14.0–49.7)
MCH: 33.1 pg (ref 25.1–34.0)
MCHC: 34.4 g/dL (ref 31.5–36.0)
MCV: 96.3 fL (ref 79.5–101.0)
MONO#: 1.4 10*3/uL — ABNORMAL HIGH (ref 0.1–0.9)
MONO%: 12.8 % (ref 0.0–14.0)
NEUT#: 7.9 10*3/uL — ABNORMAL HIGH (ref 1.5–6.5)
NEUT%: 73.1 % (ref 38.4–76.8)
Platelets: 245 10*3/uL (ref 145–400)
RBC: 3.5 10*6/uL — ABNORMAL LOW (ref 3.70–5.45)
RDW: 17.2 % — ABNORMAL HIGH (ref 11.2–14.5)
WBC: 10.8 10*3/uL — ABNORMAL HIGH (ref 3.9–10.3)
lymph#: 1.3 10*3/uL (ref 0.9–3.3)

## 2016-01-20 LAB — COMPREHENSIVE METABOLIC PANEL
ALT: 12 U/L (ref 0–55)
ANION GAP: 7 meq/L (ref 3–11)
AST: 15 U/L (ref 5–34)
Albumin: 3.7 g/dL (ref 3.5–5.0)
Alkaline Phosphatase: 140 U/L (ref 40–150)
BUN: 6.7 mg/dL — ABNORMAL LOW (ref 7.0–26.0)
CHLORIDE: 102 meq/L (ref 98–109)
CO2: 26 meq/L (ref 22–29)
CREATININE: 0.7 mg/dL (ref 0.6–1.1)
Calcium: 9.7 mg/dL (ref 8.4–10.4)
EGFR: >90 mL/min/{1.73_m2} (ref >90)
GLUCOSE: 96 mg/dL (ref 70–140)
Potassium: 4.3 mEq/L (ref 3.5–5.1)
SODIUM: 136 meq/L (ref 136–145)
TOTAL PROTEIN: 6.5 g/dL (ref 6.4–8.3)
Total Bilirubin: 0.45 mg/dL (ref 0.20–1.20)

## 2016-01-20 MED ORDER — SODIUM CHLORIDE 0.9 % IV SOLN
80.0000 mg/m2 | Freq: Once | INTRAVENOUS | Status: AC
  Administered 2016-01-20: 120 mg via INTRAVENOUS
  Filled 2016-01-20: qty 20

## 2016-01-20 MED ORDER — SODIUM CHLORIDE 0.9 % IV SOLN
20.0000 mg | Freq: Once | INTRAVENOUS | Status: AC
  Administered 2016-01-20: 20 mg via INTRAVENOUS
  Filled 2016-01-20: qty 2

## 2016-01-20 MED ORDER — DIPHENHYDRAMINE HCL 50 MG/ML IJ SOLN
50.0000 mg | Freq: Once | INTRAMUSCULAR | Status: AC
  Administered 2016-01-20: 50 mg via INTRAVENOUS

## 2016-01-20 MED ORDER — FAMOTIDINE IN NACL 20-0.9 MG/50ML-% IV SOLN
INTRAVENOUS | Status: AC
  Filled 2016-01-20: qty 50

## 2016-01-20 MED ORDER — SODIUM CHLORIDE 0.9% FLUSH
10.0000 mL | INTRAVENOUS | Status: DC | PRN
  Administered 2016-01-20: 10 mL
  Filled 2016-01-20: qty 10

## 2016-01-20 MED ORDER — HEPARIN SOD (PORK) LOCK FLUSH 100 UNIT/ML IV SOLN
500.0000 [IU] | Freq: Once | INTRAVENOUS | Status: AC | PRN
  Administered 2016-01-20: 500 [IU]
  Filled 2016-01-20: qty 5

## 2016-01-20 MED ORDER — DIPHENHYDRAMINE HCL 50 MG/ML IJ SOLN
INTRAMUSCULAR | Status: AC
  Filled 2016-01-20: qty 1

## 2016-01-20 MED ORDER — SODIUM CHLORIDE 0.9 % IV SOLN
Freq: Once | INTRAVENOUS | Status: AC
  Administered 2016-01-20: 11:00:00 via INTRAVENOUS

## 2016-01-20 MED ORDER — FAMOTIDINE IN NACL 20-0.9 MG/50ML-% IV SOLN
20.0000 mg | Freq: Once | INTRAVENOUS | Status: AC
  Administered 2016-01-20: 20 mg via INTRAVENOUS

## 2016-01-20 NOTE — Progress Notes (Signed)
Patient Care Team: Berkley Harvey, NP as PCP - General (Nurse Practitioner) Erroll Luna, MD as Consulting Physician (General Surgery) Nicholas Lose, MD as Consulting Physician (Hematology and Oncology) Kyung Rudd, MD as Consulting Physician (Radiation Oncology)  DIAGNOSIS:  Encounter Diagnosis  Name Primary?  . Malignant neoplasm of upper-outer quadrant of left breast in female, estrogen receptor negative ([place])     SUMMARY OF ONCOLOGIC HISTORY:   Breast cancer of upper-outer quadrant of left female breast ([place])   09/28/2000 Initial Biopsy    Left lumpectomy for stage I breast cancer that was ER/PR negative followed by adjuvant radiation      09/20/2015 Initial Diagnosis    Left breast mass 3 cm 1:00 position: IDC grade 2, ER/PR negative, Ki-67 60%, HER-2 negative, T2 N0 stage IIA clinical stage      10/28/2015 Surgery    Left mastectomy: IDC grade 3, 3 cm, lymphovascular invasion present, margins negative, 0/6 lymph nodes, ER 0%, PR 0%, HER-2 negative, Ki-67 60%, T2 N0 stage II a      11/25/2015 -  Chemotherapy    Adjuvant chemotherapy with Dose dense Adriamycin and Cytoxan 4 followed by Taxol 12        CHIEF COMPLIANT: Cycle 1 Taxol  INTERVAL HISTORY: Leah Mcdowell is a 60 year old with above-mentioned history left breast cancer treated with mastectomy and is now on adjuvant chemotherapy. Today is cycle 1 of Taxol. She was able to complete 4 cycles of Adriamycin and Cytoxan. Overall she tolerated the treatment fairly well. She had nausea fatigue and loss of taste and appetite. The symptoms are still ongoing. She is hoping that with Taxol Herceptin times were improved.  REVIEW OF SYSTEMS:   Constitutional: Denies fevers, chills or abnormal weight loss Eyes: Denies blurriness of vision Ears, nose, mouth, throat, and face: Denies mucositis or sore throat Respiratory: Denies cough, dyspnea or wheezes Cardiovascular: Denies palpitation, chest discomfort Gastrointestinal:   Denies nausea, heartburn or change in bowel habits Skin: Denies abnormal skin rashes Lymphatics: Denies new lymphadenopathy or easy bruising Neurological:Denies numbness, tingling or new weaknesses Behavioral/Psych: Mood is stable, no new changes  Extremities: No lower extremity edema Breast:  denies any pain or lumps or nodules in either breasts All other systems were reviewed with the patient and are negative.  I have reviewed the past medical history, past surgical history, social history and family history with the patient and they are unchanged from previous note.  ALLERGIES:  is allergic to no known allergies.  MEDICATIONS:  Current Outpatient Prescriptions  Medication Sig Dispense Refill  . gabapentin (NEURONTIN) 300 MG capsule Take 300 mg by mouth 3 (three) times daily.    . promethazine (PHENERGAN) 25 MG tablet Take 1 tablet (25 mg total) by mouth every 6 (six) hours as needed for nausea. 30 tablet 3   No current facility-administered medications for this visit.     PHYSICAL EXAMINATION: ECOG PERFORMANCE STATUS: 1 - Symptomatic but completely ambulatory  There were no vitals filed for this visit. There were no vitals filed for this visit.  GENERAL:alert, no distress and comfortable SKIN: skin color, texture, turgor are normal, no rashes or significant lesions EYES: normal, Conjunctiva are pink and non-injected, sclera clear OROPHARYNX:no exudate, no erythema and lips, buccal mucosa, and tongue normal  NECK: supple, thyroid normal size, non-tender, without nodularity LYMPH:  no palpable lymphadenopathy in the cervical, axillary or inguinal LUNGS: clear to auscultation and percussion with normal breathing effort HEART: regular rate & rhythm and no murmurs  and no lower extremity edema ABDOMEN:abdomen soft, non-tender and normal bowel sounds MUSCULOSKELETAL:no cyanosis of digits and no clubbing  NEURO: alert & oriented x 3 with fluent speech, no focal motor/sensory  deficits EXTREMITIES: No lower extremity edema  LABORATORY DATA:  I have reviewed the data as listed   Chemistry      Component Value Date/Time   NA 138 01/06/2016 1005   K 4.1 01/06/2016 1005   CL 105 10/29/2015 0351   CO2 27 01/06/2016 1005   BUN 12.6 01/06/2016 1005   CREATININE 0.7 01/06/2016 1005      Component Value Date/Time   CALCIUM 9.5 01/06/2016 1005   ALKPHOS 117 01/06/2016 1005   AST 15 01/06/2016 1005   ALT 15 01/06/2016 1005   BILITOT 0.26 01/06/2016 1005       Lab Results  Component Value Date   WBC 9.3 01/06/2016   HGB 11.9 01/06/2016   HCT 34.6 (L) 01/06/2016   MCV 95.1 01/06/2016   PLT 268 01/06/2016   NEUTROABS 6.2 01/06/2016    ASSESSMENT & PLAN:  Breast cancer of upper-outer quadrant of left female breast ([place]) Left mastectomy 10/28/2015: IDC grade 3, 3 cm, lymphovascular invasion present, margins negative, 0/6 lymph nodes, ER 0%, PR 0%, HER-2 negative, Ki-67 60%, T2 N0 stage II a  Treatment plan: Adjuvant chemotherapy with dose dense Adriamycin and Cytoxan every 2 weeks 4 followed by weekly Taxol 12 No role of adjuvant radiation if the lymph nodes and margins are negative. No role for antiestrogen therapy since she is triple negative. ------------------------------------------------------------------------------------------------------------------------------------------------------------ Current treatment: Today's cycle 1 Taxol. Echocardiogram 10/15/2015: EF 60-65% Labs have been reviewed thoroughly. Monitoring closely for chemotherapy toxicities  Chemotherapy toxicities: 1. Grade 3 neutropenia: We reduced the dosage of cycle 2 of dose dense Adriamycin and Cytoxan 2. Nausea grade 2: She is tolerating Zofran better and has not required to much of it. 3. Fatigue grade 2: From day 3 today 5 profound fatigue. Gets better afterwards 4. Alopecia 5. Loss of taste and appetite: Patient needing 6-7 small meals every day  Return to clinic in 1  weeks for Cycle 2 Taxol     No orders of the defined types were placed in this encounter.  The patient has a good understanding of the overall plan. she agrees with it. she will call with any problems that may develop before the next visit here.   Rulon Eisenmenger, MD 01/20/16

## 2016-01-20 NOTE — Patient Instructions (Addendum)
[place]  Ondansetron take as directed for nausea  Prochlorperazine take as directed for nausea  Lorazepam take as directed nausea.       Discharge Instructions for Patients Receiving Chemotherapy  Today you received the following chemotherapy agents Taxol.  To help prevent nausea and vomiting after your treatment, we encourage you to take your nausea medication.   If you develop nausea and vomiting that is not controlled by your nausea medication, call the clinic.   BELOW ARE SYMPTOMS THAT SHOULD BE REPORTED IMMEDIATELY:  *FEVER GREATER THAN 100.5 F  *CHILLS WITH OR WITHOUT FEVER  NAUSEA AND VOMITING THAT IS NOT CONTROLLED WITH YOUR NAUSEA MEDICATION  *UNUSUAL SHORTNESS OF BREATH  *UNUSUAL BRUISING OR BLEEDING  TENDERNESS IN MOUTH AND THROAT WITH OR WITHOUT PRESENCE OF ULCERS  *URINARY PROBLEMS  *BOWEL PROBLEMS  UNUSUAL RASH Items with * indicate a potential emergency and should be followed up as soon as possible.  Feel free to call the clinic you have any questions or concerns. The clinic phone number is [phone].  Please show the [place] at check-in to the Emergency Department and triage nurse.

## 2016-01-26 ENCOUNTER — Other Ambulatory Visit: Payer: Self-pay

## 2016-01-26 DIAGNOSIS — Z171 Estrogen receptor negative status [ER-]: Principal | ICD-10-CM

## 2016-01-26 DIAGNOSIS — C50412 Malignant neoplasm of upper-outer quadrant of left female breast: Secondary | ICD-10-CM

## 2016-01-26 NOTE — Assessment & Plan Note (Signed)
Left mastectomy 10/28/2015: IDC grade 3, 3 cm, lymphovascular invasion present, margins negative, 0/6 lymph nodes, ER 0%, PR 0%, HER-2 negative, Ki-67 60%, T2 N0 stage II a  Treatment plan: Adjuvant chemotherapy with dose dense Adriamycin and Cytoxan every 2 weeks 4 followed by weekly Taxol 12 No role of adjuvant radiation if the lymph nodes and margins are negative. No role for antiestrogen therapy since she is triple negative. ------------------------------------------------------------------------------------------------------------------------------------------------------------ Current treatment: Today's cycle 2 Taxol. Echocardiogram 10/15/2015: EF 60-65% Labs have been reviewed thoroughly. Monitoring closely for chemotherapy toxicities  Chemotherapy toxicities: 1. Grade 3 neutropenia: We reduced the dosage of cycle 2 of dose dense Adriamycin and Cytoxan 2. Nausea grade 2: She is tolerating Zofran better and has not required to much of it. 3. Fatigue grade 2: From day 3 today 5 profound fatigue. Gets better afterwards 4. Alopecia 5. Loss of taste and appetite: Patient needing 6-7 small meals every day  Return to clinic in 2 weeks for Cycle 4 Taxol

## 2016-01-26 NOTE — Progress Notes (Signed)
Labs entered for next appt

## 2016-01-27 ENCOUNTER — Encounter: Payer: Self-pay | Admitting: Hematology and Oncology

## 2016-01-27 ENCOUNTER — Encounter: Payer: Self-pay | Admitting: *Deleted

## 2016-01-27 ENCOUNTER — Ambulatory Visit (HOSPITAL_BASED_OUTPATIENT_CLINIC_OR_DEPARTMENT_OTHER): Payer: BLUE CROSS/BLUE SHIELD | Admitting: Hematology and Oncology

## 2016-01-27 ENCOUNTER — Ambulatory Visit (HOSPITAL_BASED_OUTPATIENT_CLINIC_OR_DEPARTMENT_OTHER): Payer: BLUE CROSS/BLUE SHIELD

## 2016-01-27 ENCOUNTER — Other Ambulatory Visit (HOSPITAL_BASED_OUTPATIENT_CLINIC_OR_DEPARTMENT_OTHER): Payer: BLUE CROSS/BLUE SHIELD

## 2016-01-27 ENCOUNTER — Encounter: Payer: BLUE CROSS/BLUE SHIELD | Admitting: Nutrition

## 2016-01-27 DIAGNOSIS — T451X5A Adverse effect of antineoplastic and immunosuppressive drugs, initial encounter: Secondary | ICD-10-CM

## 2016-01-27 DIAGNOSIS — R630 Anorexia: Secondary | ICD-10-CM

## 2016-01-27 DIAGNOSIS — R530 Neoplastic (malignant) related fatigue: Secondary | ICD-10-CM

## 2016-01-27 DIAGNOSIS — Z171 Estrogen receptor negative status [ER-]: Principal | ICD-10-CM

## 2016-01-27 DIAGNOSIS — D701 Agranulocytosis secondary to cancer chemotherapy: Secondary | ICD-10-CM | POA: Diagnosis not present

## 2016-01-27 DIAGNOSIS — C50412 Malignant neoplasm of upper-outer quadrant of left female breast: Secondary | ICD-10-CM | POA: Diagnosis not present

## 2016-01-27 DIAGNOSIS — D6481 Anemia due to antineoplastic chemotherapy: Secondary | ICD-10-CM

## 2016-01-27 DIAGNOSIS — R110 Nausea: Secondary | ICD-10-CM | POA: Diagnosis not present

## 2016-01-27 DIAGNOSIS — L658 Other specified nonscarring hair loss: Secondary | ICD-10-CM

## 2016-01-27 DIAGNOSIS — Z5111 Encounter for antineoplastic chemotherapy: Secondary | ICD-10-CM | POA: Diagnosis not present

## 2016-01-27 LAB — CBC WITH DIFFERENTIAL/PLATELET
BASO%: 1.3 % (ref 0.0–2.0)
Basophils Absolute: 0.1 10*3/uL (ref 0.0–0.1)
EOS%: 1.5 % (ref 0.0–7.0)
Eosinophils Absolute: 0.1 10*3/uL (ref 0.0–0.5)
HCT: 30.4 % — ABNORMAL LOW (ref 34.8–46.6)
HGB: 10.2 g/dL — ABNORMAL LOW (ref 11.6–15.9)
LYMPH%: 12.6 % — ABNORMAL LOW (ref 14.0–49.7)
MCH: 33.1 pg (ref 25.1–34.0)
MCHC: 33.7 g/dL (ref 31.5–36.0)
MCV: 98.3 fL (ref 79.5–101.0)
MONO#: 1.2 10*3/uL — ABNORMAL HIGH (ref 0.1–0.9)
MONO%: 23 % — ABNORMAL HIGH (ref 0.0–14.0)
NEUT#: 3.2 10*3/uL (ref 1.5–6.5)
NEUT%: 61.6 % (ref 38.4–76.8)
Platelets: 406 10*3/uL — ABNORMAL HIGH (ref 145–400)
RBC: 3.09 10*6/uL — ABNORMAL LOW (ref 3.70–5.45)
RDW: 18.7 % — ABNORMAL HIGH (ref 11.2–14.5)
WBC: 5.2 10*3/uL (ref 3.9–10.3)
lymph#: 0.7 10*3/uL — ABNORMAL LOW (ref 0.9–3.3)

## 2016-01-27 LAB — COMPREHENSIVE METABOLIC PANEL WITH GFR
ALT: 14 U/L (ref 0–55)
AST: 18 U/L (ref 5–34)
Albumin: 3.5 g/dL (ref 3.5–5.0)
Alkaline Phosphatase: 105 U/L (ref 40–150)
Anion Gap: 9 meq/L (ref 3–11)
BUN: 10.7 mg/dL (ref 7.0–26.0)
CO2: 26 meq/L (ref 22–29)
Calcium: 9.2 mg/dL (ref 8.4–10.4)
Chloride: 104 meq/L (ref 98–109)
Creatinine: 0.7 mg/dL (ref 0.6–1.1)
EGFR: >90 ml/min/1.73 m2
Glucose: 93 mg/dL (ref 70–140)
Potassium: 3.9 meq/L (ref 3.5–5.1)
Sodium: 140 meq/L (ref 136–145)
Total Bilirubin: 0.37 mg/dL (ref 0.20–1.20)
Total Protein: 6.4 g/dL (ref 6.4–8.3)

## 2016-01-27 MED ORDER — SODIUM CHLORIDE 0.9 % IV SOLN
20.0000 mg | Freq: Once | INTRAVENOUS | Status: AC
  Administered 2016-01-27: 20 mg via INTRAVENOUS
  Filled 2016-01-27: qty 2

## 2016-01-27 MED ORDER — SODIUM CHLORIDE 0.9 % IV SOLN
80.0000 mg/m2 | Freq: Once | INTRAVENOUS | Status: AC
  Administered 2016-01-27: 120 mg via INTRAVENOUS
  Filled 2016-01-27: qty 20

## 2016-01-27 MED ORDER — LORAZEPAM 1 MG PO TABS: 1.0000 mg | ORAL_TABLET | Freq: Three times a day (TID) | ORAL | 0 refills | Status: DC | PRN

## 2016-01-27 MED ORDER — DIPHENHYDRAMINE HCL 50 MG/ML IJ SOLN
INTRAMUSCULAR | Status: AC
  Filled 2016-01-27: qty 1

## 2016-01-27 MED ORDER — SODIUM CHLORIDE 0.9% FLUSH
10.0000 mL | INTRAVENOUS | Status: DC | PRN
  Administered 2016-01-27: 10 mL
  Filled 2016-01-27: qty 10

## 2016-01-27 MED ORDER — DIPHENHYDRAMINE HCL 50 MG/ML IJ SOLN
25.0000 mg | Freq: Once | INTRAMUSCULAR | Status: AC
  Administered 2016-01-27: 25 mg via INTRAVENOUS

## 2016-01-27 MED ORDER — FAMOTIDINE IN NACL 20-0.9 MG/50ML-% IV SOLN
20.0000 mg | Freq: Once | INTRAVENOUS | Status: AC
  Administered 2016-01-27: 20 mg via INTRAVENOUS

## 2016-01-27 MED ORDER — HEPARIN SOD (PORK) LOCK FLUSH 100 UNIT/ML IV SOLN
500.0000 [IU] | Freq: Once | INTRAVENOUS | Status: AC | PRN
  Administered 2016-01-27: 500 [IU]
  Filled 2016-01-27: qty 5

## 2016-01-27 MED ORDER — SODIUM CHLORIDE 0.9 % IV SOLN
Freq: Once | INTRAVENOUS | Status: AC
  Administered 2016-01-27: 12:00:00 via INTRAVENOUS

## 2016-01-27 MED ORDER — FAMOTIDINE IN NACL 20-0.9 MG/50ML-% IV SOLN
INTRAVENOUS | Status: AC
  Filled 2016-01-27: qty 50

## 2016-01-27 NOTE — Progress Notes (Signed)
Patient Care Team: Berkley Harvey, NP as PCP - General (Nurse Practitioner) Erroll Luna, MD as Consulting Physician (General Surgery) Nicholas Lose, MD as Consulting Physician (Hematology and Oncology) Kyung Rudd, MD as Consulting Physician (Radiation Oncology)  DIAGNOSIS:  Encounter Diagnosis  Name Primary?  . Malignant neoplasm of upper-outer quadrant of left breast in female, estrogen receptor negative ([place])     SUMMARY OF ONCOLOGIC HISTORY:   Breast cancer of upper-outer quadrant of left female breast ([place])   09/28/2000 Initial Biopsy    Left lumpectomy for stage I breast cancer that was ER/PR negative followed by adjuvant radiation      09/20/2015 Initial Diagnosis    Left breast mass 3 cm 1:00 position: IDC grade 2, ER/PR negative, Ki-67 60%, HER-2 negative, T2 N0 stage IIA clinical stage      10/28/2015 Surgery    Left mastectomy: IDC grade 3, 3 cm, lymphovascular invasion present, margins negative, 0/6 lymph nodes, ER 0%, PR 0%, HER-2 negative, Ki-67 60%, T2 N0 stage II a      11/25/2015 -  Chemotherapy    Adjuvant chemotherapy with Dose dense Adriamycin and Cytoxan 4 followed by Taxol 12        CHIEF COMPLIANT: Cycle 2 Taxol  INTERVAL HISTORY: Leah Mcdowell is a 60 year old with above-mentioned history left breast cancer treated with mastectomy and is currently on adjuvant chemotherapy. Today is cycle 2 of Taxol. She tolerated cycle 1 Taxol extremely well. She did not have any nausea vomiting. She does have mild to moderate fatigue. Denies any neuropathy symptoms.  REVIEW OF SYSTEMS:   Constitutional: Denies fevers, chills or abnormal weight loss Eyes: Denies blurriness of vision Ears, nose, mouth, throat, and face: Denies mucositis or sore throat Respiratory: Denies cough, dyspnea or wheezes Cardiovascular: Denies palpitation, chest discomfort Gastrointestinal:  Denies nausea, heartburn or change in bowel habits Skin: Denies abnormal skin rashes Lymphatics:  Denies new lymphadenopathy or easy bruising Neurological:Denies numbness, tingling or new weaknesses Behavioral/Psych: Mood is stable, no new changes  Extremities: No lower extremity edema Breast:  denies any pain or lumps or nodules in either breasts All other systems were reviewed with the patient and are negative.  I have reviewed the past medical history, past surgical history, social history and family history with the patient and they are unchanged from previous note.  ALLERGIES:  is allergic to no known allergies.  MEDICATIONS:  Current Outpatient Prescriptions  Medication Sig Dispense Refill  . gabapentin (NEURONTIN) 300 MG capsule Take 300 mg by mouth 3 (three) times daily.    Marland Kitchen LORazepam (ATIVAN) 1 MG tablet Take 1 tablet (1 mg total) by mouth every 8 (eight) hours as needed for anxiety (or nausea). 30 tablet 0  . promethazine (PHENERGAN) 25 MG tablet Take 1 tablet (25 mg total) by mouth every 6 (six) hours as needed for nausea. 30 tablet 3   No current facility-administered medications for this visit.     PHYSICAL EXAMINATION: ECOG PERFORMANCE STATUS: 1 - Symptomatic but completely ambulatory  Vitals:   01/27/16 1112  BP: 101/72  Pulse: (!) 103  Resp: 18  Temp: 98 F (36.7 C)   Filed Weights   01/27/16 1112  Weight: 102 lb 14.4 oz (46.7 kg)    GENERAL:alert, no distress and comfortable SKIN: skin color, texture, turgor are normal, no rashes or significant lesions EYES: normal, Conjunctiva are pink and non-injected, sclera clear OROPHARYNX:no exudate, no erythema and lips, buccal mucosa, and tongue normal  NECK: supple, thyroid normal  size, non-tender, without nodularity LYMPH:  no palpable lymphadenopathy in the cervical, axillary or inguinal LUNGS: clear to auscultation and percussion with normal breathing effort HEART: regular rate & rhythm and no murmurs and no lower extremity edema ABDOMEN:abdomen soft, non-tender and normal bowel sounds MUSCULOSKELETAL:no  cyanosis of digits and no clubbing  NEURO: alert & oriented x 3 with fluent speech, no focal motor/sensory deficits EXTREMITIES: No lower extremity edema  LABORATORY DATA:  I have reviewed the data as listed   Chemistry      Component Value Date/Time   NA 140 01/27/2016 1056   K 3.9 01/27/2016 1056   CL 105 10/29/2015 0351   CO2 26 01/27/2016 1056   BUN 10.7 01/27/2016 1056   CREATININE 0.7 01/27/2016 1056      Component Value Date/Time   CALCIUM 9.2 01/27/2016 1056   ALKPHOS 105 01/27/2016 1056   AST 18 01/27/2016 1056   ALT 14 01/27/2016 1056   BILITOT 0.37 01/27/2016 1056       Lab Results  Component Value Date   WBC 5.2 01/27/2016   HGB 10.2 (L) 01/27/2016   HCT 30.4 (L) 01/27/2016   MCV 98.3 01/27/2016   PLT 406 (H) 01/27/2016   NEUTROABS 3.2 01/27/2016    ASSESSMENT & PLAN:  Breast cancer of upper-outer quadrant of left female breast ([place]) Left mastectomy 10/28/2015: IDC grade 3, 3 cm, lymphovascular invasion present, margins negative, 0/6 lymph nodes, ER 0%, PR 0%, HER-2 negative, Ki-67 60%, T2 N0 stage II a  Treatment plan: Adjuvant chemotherapy with dose dense Adriamycin and Cytoxan every 2 weeks 4 followed by weekly Taxol 12 No role of adjuvant radiation if the lymph nodes and margins are negative. No role for antiestrogen therapy since she is triple negative. ------------------------------------------------------------------------------------------------------------------------------------------------------------ Current treatment: Today's cycle 2 Taxol. Echocardiogram 10/15/2015: EF 60-65% Labs have been reviewed thoroughly. Monitoring closely for chemotherapy toxicities  Chemotherapy toxicities: 1. Grade 3 neutropenia: We reduced the dosage of cycle 2 of dose dense Adriamycin and Cytoxan 2. Nausea grade 2: She is tolerating Zofran better and has not required to much of it. 3. Fatigue grade 2: From day 3 today 5 profound fatigue. Gets better  afterwards 4. Alopecia 5. Loss of taste and appetite: Patient needing 6-7 small meals every day  Return to clinic in 2 weeks for Cycle 4 Taxol    No orders of the defined types were placed in this encounter.  The patient has a good understanding of the overall plan. she agrees with it. she will call with any problems that may develop before the next visit here.   Rulon Eisenmenger, MD 01/27/16

## 2016-01-27 NOTE — Patient Instructions (Signed)
[facility] Discharge Instructions for Patients Receiving Chemotherapy  Today you received the following chemotherapy agents taxol  To help prevent nausea and vomiting after your treatment, we encourage you to take your nausea medication as directed   If you develop nausea and vomiting that is not controlled by your nausea medication, call the clinic.   BELOW ARE SYMPTOMS THAT SHOULD BE REPORTED IMMEDIATELY:  *FEVER GREATER THAN 100.5 F  *CHILLS WITH OR WITHOUT FEVER  NAUSEA AND VOMITING THAT IS NOT CONTROLLED WITH YOUR NAUSEA MEDICATION  *UNUSUAL SHORTNESS OF BREATH  *UNUSUAL BRUISING OR BLEEDING  TENDERNESS IN MOUTH AND THROAT WITH OR WITHOUT PRESENCE OF ULCERS  *URINARY PROBLEMS  *BOWEL PROBLEMS  UNUSUAL RASH Items with * indicate a potential emergency and should be followed up as soon as possible.  Feel free to call the clinic you have any questions or concerns. The clinic phone number is [phone].  

## 2016-02-02 ENCOUNTER — Other Ambulatory Visit: Payer: Self-pay | Admitting: Emergency Medicine

## 2016-02-02 DIAGNOSIS — Z171 Estrogen receptor negative status [ER-]: Principal | ICD-10-CM

## 2016-02-02 DIAGNOSIS — C50412 Malignant neoplasm of upper-outer quadrant of left female breast: Secondary | ICD-10-CM

## 2016-02-03 ENCOUNTER — Encounter: Payer: BLUE CROSS/BLUE SHIELD | Admitting: Nutrition

## 2016-02-03 ENCOUNTER — Other Ambulatory Visit (HOSPITAL_BASED_OUTPATIENT_CLINIC_OR_DEPARTMENT_OTHER): Payer: BLUE CROSS/BLUE SHIELD

## 2016-02-03 ENCOUNTER — Ambulatory Visit (HOSPITAL_BASED_OUTPATIENT_CLINIC_OR_DEPARTMENT_OTHER): Payer: BLUE CROSS/BLUE SHIELD

## 2016-02-03 VITALS — BP 112/63 | HR 97 | Temp 98.0°F | Resp 18

## 2016-02-03 DIAGNOSIS — Z171 Estrogen receptor negative status [ER-]: Principal | ICD-10-CM

## 2016-02-03 DIAGNOSIS — Z5111 Encounter for antineoplastic chemotherapy: Secondary | ICD-10-CM | POA: Diagnosis not present

## 2016-02-03 DIAGNOSIS — C50412 Malignant neoplasm of upper-outer quadrant of left female breast: Secondary | ICD-10-CM

## 2016-02-03 LAB — CBC WITH DIFFERENTIAL/PLATELET
BASO%: 2.7 % — ABNORMAL HIGH (ref 0.0–2.0)
BASOS ABS: 0.1 10*3/uL (ref 0.0–0.1)
EOS ABS: 0.3 10*3/uL (ref 0.0–0.5)
EOS%: 5.8 % (ref 0.0–7.0)
HEMATOCRIT: 33.8 % — AB (ref 34.8–46.6)
HGB: 11.2 g/dL — ABNORMAL LOW (ref 11.6–15.9)
LYMPH#: 1.1 10*3/uL (ref 0.9–3.3)
LYMPH%: 22.4 % (ref 14.0–49.7)
MCH: 32.9 pg (ref 25.1–34.0)
MCHC: 33 g/dL (ref 31.5–36.0)
MCV: 99.4 fL (ref 79.5–101.0)
MONO#: 1 10*3/uL — ABNORMAL HIGH (ref 0.1–0.9)
MONO%: 21.3 % — ABNORMAL HIGH (ref 0.0–14.0)
NEUT#: 2.4 10*3/uL (ref 1.5–6.5)
NEUT%: 47.8 % (ref 38.4–76.8)
PLATELETS: 360 10*3/uL (ref 145–400)
RBC: 3.4 10*6/uL — ABNORMAL LOW (ref 3.70–5.45)
RDW: 18.7 % — ABNORMAL HIGH (ref 11.2–14.5)
WBC: 4.9 10*3/uL (ref 3.9–10.3)

## 2016-02-03 LAB — COMPREHENSIVE METABOLIC PANEL
ALT: 16 U/L (ref 0–55)
ANION GAP: 9 meq/L (ref 3–11)
AST: 19 U/L (ref 5–34)
Albumin: 3.6 g/dL (ref 3.5–5.0)
Alkaline Phosphatase: 113 U/L (ref 40–150)
BILIRUBIN TOTAL: 0.54 mg/dL (ref 0.20–1.20)
BUN: 7.6 mg/dL (ref 7.0–26.0)
CHLORIDE: 101 meq/L (ref 98–109)
CO2: 25 meq/L (ref 22–29)
Calcium: 9.5 mg/dL (ref 8.4–10.4)
Creatinine: 0.6 mg/dL (ref 0.6–1.1)
Glucose: 102 mg/dl (ref 70–140)
POTASSIUM: 3.8 meq/L (ref 3.5–5.1)
Sodium: 135 mEq/L — ABNORMAL LOW (ref 136–145)
TOTAL PROTEIN: 6.6 g/dL (ref 6.4–8.3)

## 2016-02-03 MED ORDER — DIPHENHYDRAMINE HCL 50 MG/ML IJ SOLN
25.0000 mg | Freq: Once | INTRAMUSCULAR | Status: AC
  Administered 2016-02-03: 25 mg via INTRAVENOUS

## 2016-02-03 MED ORDER — FAMOTIDINE IN NACL 20-0.9 MG/50ML-% IV SOLN
INTRAVENOUS | Status: AC
  Filled 2016-02-03: qty 50

## 2016-02-03 MED ORDER — DIPHENHYDRAMINE HCL 50 MG/ML IJ SOLN
INTRAMUSCULAR | Status: AC
Start: 2016-02-03 — End: 2016-02-03
  Filled 2016-02-03: qty 1

## 2016-02-03 MED ORDER — FAMOTIDINE IN NACL 20-0.9 MG/50ML-% IV SOLN
20.0000 mg | Freq: Once | INTRAVENOUS | Status: AC
  Administered 2016-02-03: 20 mg via INTRAVENOUS

## 2016-02-03 MED ORDER — HEPARIN SOD (PORK) LOCK FLUSH 100 UNIT/ML IV SOLN
500.0000 [IU] | Freq: Once | INTRAVENOUS | Status: AC | PRN
  Administered 2016-02-03: 500 [IU]
  Filled 2016-02-03: qty 5

## 2016-02-03 MED ORDER — SODIUM CHLORIDE 0.9 % IV SOLN
20.0000 mg | Freq: Once | INTRAVENOUS | Status: AC
  Administered 2016-02-03: 20 mg via INTRAVENOUS
  Filled 2016-02-03: qty 2

## 2016-02-03 MED ORDER — SODIUM CHLORIDE 0.9 % IV SOLN
Freq: Once | INTRAVENOUS | Status: AC
  Administered 2016-02-03: 12:00:00 via INTRAVENOUS

## 2016-02-03 MED ORDER — SODIUM CHLORIDE 0.9% FLUSH
10.0000 mL | INTRAVENOUS | Status: DC | PRN
  Administered 2016-02-03: 10 mL
  Filled 2016-02-03: qty 10

## 2016-02-03 MED ORDER — SODIUM CHLORIDE 0.9 % IV SOLN
80.0000 mg/m2 | Freq: Once | INTRAVENOUS | Status: AC
  Administered 2016-02-03: 120 mg via INTRAVENOUS
  Filled 2016-02-03: qty 20

## 2016-02-03 NOTE — Patient Instructions (Signed)
[facility] Discharge Instructions for Patients Receiving Chemotherapy  Today you received the following chemotherapy agents Taxol   To help prevent nausea and vomiting after your treatment, we encourage you to take your nausea medication as directed.   If you develop nausea and vomiting that is not controlled by your nausea medication, call the clinic.   BELOW ARE SYMPTOMS THAT SHOULD BE REPORTED IMMEDIATELY:  *FEVER GREATER THAN 100.5 F  *CHILLS WITH OR WITHOUT FEVER  NAUSEA AND VOMITING THAT IS NOT CONTROLLED WITH YOUR NAUSEA MEDICATION  *UNUSUAL SHORTNESS OF BREATH  *UNUSUAL BRUISING OR BLEEDING  TENDERNESS IN MOUTH AND THROAT WITH OR WITHOUT PRESENCE OF ULCERS  *URINARY PROBLEMS  *BOWEL PROBLEMS  UNUSUAL RASH Items with * indicate a potential emergency and should be followed up as soon as possible.  Feel free to call the clinic you have any questions or concerns. The clinic phone number is [phone].  Please show the CHEMO ALERT CARD at check-in to the Emergency Department and triage nurse.   

## 2016-02-09 ENCOUNTER — Other Ambulatory Visit: Payer: Self-pay

## 2016-02-09 DIAGNOSIS — C50412 Malignant neoplasm of upper-outer quadrant of left female breast: Secondary | ICD-10-CM

## 2016-02-09 DIAGNOSIS — Z171 Estrogen receptor negative status [ER-]: Principal | ICD-10-CM

## 2016-02-09 NOTE — Assessment & Plan Note (Signed)
Left mastectomy 10/28/2015: IDC grade 3, 3 cm, lymphovascular invasion present, margins negative, 0/6 lymph nodes, ER 0%, PR 0%, HER-2 negative, Ki-67 60%, T2 N0 stage II a  Treatment plan: Adjuvant chemotherapy with dose dense Adriamycin and Cytoxan every 2 weeks 4 followed by weekly Taxol 12 No role of adjuvant radiation if the lymph nodes and margins are negative. No role for antiestrogen therapy since she is triple negative. ------------------------------------------------------------------------------------------------------------------------------------------------------------ Current treatment: Today's cycle 4Taxol. Echocardiogram 10/15/2015: EF 60-65% Labs have been reviewed thoroughly. Monitoring closely for chemotherapy toxicities  Chemotherapy toxicities: 1. Grade 3 neutropenia: We reducedthe dosage of cycle 2 of dose dense Adriamycin and Cytoxan 2. Nausea grade 2: She is tolerating Zofran better and has not required to much of it. 3. Fatigue grade 2: From day 3 today 5 profound fatigue. Gets better afterwards 4. Alopecia 5. Loss of taste and appetite: Patient needing 6-7 small meals every day  Return to clinic in 2weeksfor Cycle 6 Taxol

## 2016-02-10 ENCOUNTER — Ambulatory Visit: Payer: BLUE CROSS/BLUE SHIELD | Admitting: Nutrition

## 2016-02-10 ENCOUNTER — Ambulatory Visit (HOSPITAL_BASED_OUTPATIENT_CLINIC_OR_DEPARTMENT_OTHER): Payer: BLUE CROSS/BLUE SHIELD | Admitting: Hematology and Oncology

## 2016-02-10 ENCOUNTER — Encounter: Payer: Self-pay | Admitting: Hematology and Oncology

## 2016-02-10 ENCOUNTER — Other Ambulatory Visit (HOSPITAL_BASED_OUTPATIENT_CLINIC_OR_DEPARTMENT_OTHER): Payer: BLUE CROSS/BLUE SHIELD

## 2016-02-10 ENCOUNTER — Encounter: Payer: Self-pay | Admitting: *Deleted

## 2016-02-10 ENCOUNTER — Ambulatory Visit (HOSPITAL_BASED_OUTPATIENT_CLINIC_OR_DEPARTMENT_OTHER): Payer: BLUE CROSS/BLUE SHIELD

## 2016-02-10 VITALS — BP 107/57 | HR 104 | Temp 97.7°F | Resp 20 | Ht 68.0 in | Wt 100.3 lb

## 2016-02-10 DIAGNOSIS — F329 Major depressive disorder, single episode, unspecified: Secondary | ICD-10-CM | POA: Diagnosis not present

## 2016-02-10 DIAGNOSIS — Z5111 Encounter for antineoplastic chemotherapy: Secondary | ICD-10-CM

## 2016-02-10 DIAGNOSIS — C50412 Malignant neoplasm of upper-outer quadrant of left female breast: Secondary | ICD-10-CM

## 2016-02-10 DIAGNOSIS — Z171 Estrogen receptor negative status [ER-]: Principal | ICD-10-CM

## 2016-02-10 DIAGNOSIS — T451X5A Adverse effect of antineoplastic and immunosuppressive drugs, initial encounter: Principal | ICD-10-CM

## 2016-02-10 DIAGNOSIS — D6481 Anemia due to antineoplastic chemotherapy: Secondary | ICD-10-CM

## 2016-02-10 LAB — CBC WITH DIFFERENTIAL/PLATELET
BASO%: 2.1 % — ABNORMAL HIGH (ref 0.0–2.0)
BASOS ABS: 0.1 10*3/uL (ref 0.0–0.1)
EOS%: 7.1 % — AB (ref 0.0–7.0)
Eosinophils Absolute: 0.3 10*3/uL (ref 0.0–0.5)
HEMATOCRIT: 32.8 % — AB (ref 34.8–46.6)
HEMOGLOBIN: 11.3 g/dL — AB (ref 11.6–15.9)
LYMPH#: 0.8 10*3/uL — AB (ref 0.9–3.3)
LYMPH%: 16.3 % (ref 14.0–49.7)
MCH: 33.7 pg (ref 25.1–34.0)
MCHC: 34.5 g/dL (ref 31.5–36.0)
MCV: 97.9 fL (ref 79.5–101.0)
MONO#: 0.8 10*3/uL (ref 0.1–0.9)
MONO%: 17.1 % — ABNORMAL HIGH (ref 0.0–14.0)
NEUT#: 2.7 10*3/uL (ref 1.5–6.5)
NEUT%: 57.4 % (ref 38.4–76.8)
PLATELETS: 322 10*3/uL (ref 145–400)
RBC: 3.35 10*6/uL — ABNORMAL LOW (ref 3.70–5.45)
RDW: 16.7 % — AB (ref 11.2–14.5)
WBC: 4.7 10*3/uL (ref 3.9–10.3)

## 2016-02-10 LAB — COMPREHENSIVE METABOLIC PANEL
ALBUMIN: 3.8 g/dL (ref 3.5–5.0)
ALK PHOS: 99 U/L (ref 40–150)
ALT: 13 U/L (ref 0–55)
ANION GAP: 8 meq/L (ref 3–11)
AST: 21 U/L (ref 5–34)
BUN: 12.8 mg/dL (ref 7.0–26.0)
CALCIUM: 9.8 mg/dL (ref 8.4–10.4)
CHLORIDE: 100 meq/L (ref 98–109)
CO2: 27 mEq/L (ref 22–29)
Creatinine: 0.7 mg/dL (ref 0.6–1.1)
EGFR: >90 mL/min/{1.73_m2} (ref >90)
Glucose: 104 mg/dl (ref 70–140)
POTASSIUM: 4 meq/L (ref 3.5–5.1)
Sodium: 135 mEq/L — ABNORMAL LOW (ref 136–145)
Total Bilirubin: 0.54 mg/dL (ref 0.20–1.20)
Total Protein: 6.7 g/dL (ref 6.4–8.3)

## 2016-02-10 MED ORDER — SODIUM CHLORIDE 0.9 % IV SOLN
60.0000 mg/m2 | Freq: Once | INTRAVENOUS | Status: AC
  Administered 2016-02-10: 90 mg via INTRAVENOUS
  Filled 2016-02-10: qty 15

## 2016-02-10 MED ORDER — FAMOTIDINE IN NACL 20-0.9 MG/50ML-% IV SOLN
20.0000 mg | Freq: Once | INTRAVENOUS | Status: AC
  Administered 2016-02-10: 20 mg via INTRAVENOUS

## 2016-02-10 MED ORDER — DEXAMETHASONE SODIUM PHOSPHATE 10 MG/ML IJ SOLN
10.0000 mg | Freq: Once | INTRAMUSCULAR | Status: AC
  Administered 2016-02-10: 10 mg via INTRAVENOUS

## 2016-02-10 MED ORDER — HEPARIN SOD (PORK) LOCK FLUSH 100 UNIT/ML IV SOLN
500.0000 [IU] | Freq: Once | INTRAVENOUS | Status: AC | PRN
  Administered 2016-02-10: 500 [IU]
  Filled 2016-02-10: qty 5

## 2016-02-10 MED ORDER — VENLAFAXINE HCL ER 37.5 MG PO CP24: 37.5000 mg | ORAL_CAPSULE | Freq: Every day | ORAL | 3 refills | Status: DC

## 2016-02-10 MED ORDER — DIPHENHYDRAMINE HCL 50 MG/ML IJ SOLN
INTRAMUSCULAR | Status: AC
  Filled 2016-02-10: qty 1

## 2016-02-10 MED ORDER — FAMOTIDINE IN NACL 20-0.9 MG/50ML-% IV SOLN
INTRAVENOUS | Status: AC
  Filled 2016-02-10: qty 50

## 2016-02-10 MED ORDER — DIPHENHYDRAMINE HCL 50 MG/ML IJ SOLN
25.0000 mg | Freq: Once | INTRAMUSCULAR | Status: AC
Start: 2016-02-10 — End: 2016-02-10
  Administered 2016-02-10: 25 mg via INTRAVENOUS

## 2016-02-10 MED ORDER — PALONOSETRON HCL INJECTION 0.25 MG/5ML
INTRAVENOUS | Status: AC
  Filled 2016-02-10: qty 5

## 2016-02-10 MED ORDER — SODIUM CHLORIDE 0.9% FLUSH
10.0000 mL | INTRAVENOUS | Status: DC | PRN
  Administered 2016-02-10: 10 mL
  Filled 2016-02-10: qty 10

## 2016-02-10 MED ORDER — PALONOSETRON HCL INJECTION 0.25 MG/5ML
0.2500 mg | Freq: Once | INTRAVENOUS | Status: AC
  Administered 2016-02-10: 0.25 mg via INTRAVENOUS

## 2016-02-10 MED ORDER — SODIUM CHLORIDE 0.9 % IV SOLN
Freq: Once | INTRAVENOUS | Status: AC
  Administered 2016-02-10: 12:00:00 via INTRAVENOUS

## 2016-02-10 MED ORDER — DEXAMETHASONE SODIUM PHOSPHATE 10 MG/ML IJ SOLN
INTRAMUSCULAR | Status: AC
  Filled 2016-02-10: qty 1

## 2016-02-10 MED ORDER — SODIUM CHLORIDE 0.9 % IV SOLN
10.0000 mg | Freq: Once | INTRAVENOUS | Status: DC
  Filled 2016-02-10: qty 1

## 2016-02-10 NOTE — Progress Notes (Signed)
Patient was identified to be at risk on the MST secondary to weight loss and poor appetite.  60 year old female diagnosed with stage I breast cancer in 2002, status post mastectomy in 2017.  She is receiving chemotherapy with Cytoxan, Adriamycin and Taxol.  Past medical history includes Ethelene Hal and moderate alcohol use.  Medications include Phenergan.  Lab includes albumin 3.7.  Height: 5 feet 8 inches. Weight: 100.3 pounds. Usual body weight: 110 pounds per patient. BMI: 15.25.  Patient reports she has a poor appetite. Many smells decrease her desire to eat. She has taste alterations. Patient had mouth sores but those have now resolved. Patient has tried ensure and boost but does not care for them.  Nutrition diagnosis:  Underweight related to inadequate oral intake as evidenced by 9% weight loss from usual body weight and BMI of 15.25.  Intervention:  Patient was educated to try to consume small frequent meals and snacks with high-calorie, high-protein foods. Recommended patient consume Carnation breakfast essentials and provided samples. Educated patient on strategies for improving appetite and taste alterations. Fact sheets were provided. Questions were answered.  Teach back method used and contact information was given.  Monitoring, evaluation, goals:  Patient will tolerate increased calories and protein to minimize further weight loss.  Next visit: Thursday, January 11, during infusion.  **Disclaimer: This note was dictated with voice recognition software. Similar sounding words can inadvertently be transcribed and this note may contain transcription errors which may not have been corrected upon publication of note.**

## 2016-02-10 NOTE — Patient Instructions (Signed)
[facility] Discharge Instructions for Patients Receiving Chemotherapy  Today you received the following chemotherapy agents Taxol  To help prevent nausea and vomiting after your treatment, we encourage you to take your nausea medication   If you develop nausea and vomiting that is not controlled by your nausea medication, call the clinic.   BELOW ARE SYMPTOMS THAT SHOULD BE REPORTED IMMEDIATELY:  *FEVER GREATER THAN 100.5 F  *CHILLS WITH OR WITHOUT FEVER  NAUSEA AND VOMITING THAT IS NOT CONTROLLED WITH YOUR NAUSEA MEDICATION  *UNUSUAL SHORTNESS OF BREATH  *UNUSUAL BRUISING OR BLEEDING  TENDERNESS IN MOUTH AND THROAT WITH OR WITHOUT PRESENCE OF ULCERS  *URINARY PROBLEMS  *BOWEL PROBLEMS  UNUSUAL RASH Items with * indicate a potential emergency and should be followed up as soon as possible.  Feel free to call the clinic you have any questions or concerns. The clinic phone number is [phone].  Please show the CHEMO ALERT CARD at check-in to the Emergency Department and triage nurse.   

## 2016-02-10 NOTE — Progress Notes (Signed)
Patient Care Team: Berkley Harvey, NP as PCP - General (Nurse Practitioner) Erroll Luna, MD as Consulting Physician (General Surgery) Nicholas Lose, MD as Consulting Physician (Hematology and Oncology) Kyung Rudd, MD as Consulting Physician (Radiation Oncology)  DIAGNOSIS:  Encounter Diagnoses  Name Primary?  . Malignant neoplasm of upper-outer quadrant of left breast in female, estrogen receptor negative ([place])   . Antineoplastic chemotherapy induced anemia Yes    SUMMARY OF ONCOLOGIC HISTORY:   Breast cancer of upper-outer quadrant of left female breast ([place])   09/28/2000 Initial Biopsy    Left lumpectomy for stage I breast cancer that was ER/PR negative followed by adjuvant radiation      09/20/2015 Initial Diagnosis    Left breast mass 3 cm 1:00 position: IDC grade 2, ER/PR negative, Ki-67 60%, HER-2 negative, T2 N0 stage IIA clinical stage      10/28/2015 Surgery    Left mastectomy: IDC grade 3, 3 cm, lymphovascular invasion present, margins negative, 0/6 lymph nodes, ER 0%, PR 0%, HER-2 negative, Ki-67 60%, T2 N0 stage II a      11/25/2015 -  Chemotherapy    Adjuvant chemotherapy with Dose dense Adriamycin and Cytoxan 4 followed by Taxol 12        CHIEF COMPLIANT: Cycle 4 Taxol  INTERVAL HISTORY: Leah Mcdowell is a 60 year old with above-mentioned history of left breast cancer with lumpectomy followed by mastectomy. She is currently on adjuvant chemotherapy for triple negative disease. She is currently receiving Taxol and today is cycle 4 of Taxol. She has done fairly well from Taxol. She does have fatigue related to chemotherapy. Patient has decreased appetite from lack of taste.She is very tearful today because she feels quite miserable. The number of days that she actually feels good is maybe 1 or 2 days in the week. She is continuing to lose weight.  REVIEW OF SYSTEMS:   Constitutional: Denies fevers, chills or abnormal weight loss Eyes: Denies blurriness of  vision Ears, nose, mouth, throat, and face: Denies mucositis or sore throat, poor taste and appetite Respiratory: Denies cough, dyspnea or wheezes Cardiovascular: Denies palpitation, chest discomfort Gastrointestinal:  Denies nausea, heartburn or change in bowel habits Skin: Denies abnormal skin rashes Lymphatics: Denies new lymphadenopathy or easy bruising Neurological:Denies numbness, tingling or new weaknesses Behavioral/Psych: Appears depressed  Extremities: No lower extremity edema  All other systems were reviewed with the patient and are negative.  I have reviewed the past medical history, past surgical history, social history and family history with the patient and they are unchanged from previous note.  ALLERGIES:  is allergic to no known allergies.  MEDICATIONS:  Current Outpatient Prescriptions  Medication Sig Dispense Refill  . gabapentin (NEURONTIN) 300 MG capsule Take 300 mg by mouth 3 (three) times daily.    Marland Kitchen LORazepam (ATIVAN) 1 MG tablet Take 1 tablet (1 mg total) by mouth every 8 (eight) hours as needed for anxiety (or nausea). 30 tablet 0  . promethazine (PHENERGAN) 25 MG tablet Take 1 tablet (25 mg total) by mouth every 6 (six) hours as needed for nausea. 30 tablet 3  . venlafaxine XR (EFFEXOR-XR) 37.5 MG 24 hr capsule Take 1 capsule (37.5 mg total) by mouth daily with breakfast. 30 capsule 3   No current facility-administered medications for this visit.     PHYSICAL EXAMINATION: ECOG PERFORMANCE STATUS: 1 - Symptomatic but completely ambulatory  Vitals:   02/10/16 1018  BP: (!) 107/57  Pulse: (!) 104  Resp: 20  Temp: 97.7  F (36.5 C)   Filed Weights   02/10/16 1018  Weight: 100 lb 4.8 oz (45.5 kg)    GENERAL:alert, no distress and comfortable SKIN: skin color, texture, turgor are normal, no rashes or significant lesions EYES: normal, Conjunctiva are pink and non-injected, sclera clear OROPHARYNX:no exudate, no erythema and lips, buccal mucosa, and  tongue normal  NECK: supple, thyroid normal size, non-tender, without nodularity LYMPH:  no palpable lymphadenopathy in the cervical, axillary or inguinal LUNGS: clear to auscultation and percussion with normal breathing effort HEART: regular rate & rhythm and no murmurs and no lower extremity edema ABDOMEN:abdomen soft, non-tender and normal bowel sounds MUSCULOSKELETAL:no cyanosis of digits and no clubbing  NEURO: alert & oriented x 3 with fluent speech, no focal motor/sensory deficits EXTREMITIES: No lower extremity edema  LABORATORY DATA:  I have reviewed the data as listed   Chemistry      Component Value Date/Time   NA 135 (L) 02/10/2016 1009   K 4.0 02/10/2016 1009   CL 105 10/29/2015 0351   CO2 27 02/10/2016 1009   BUN 12.8 02/10/2016 1009   CREATININE 0.7 02/10/2016 1009      Component Value Date/Time   CALCIUM 9.8 02/10/2016 1009   ALKPHOS 99 02/10/2016 1009   AST 21 02/10/2016 1009   ALT 13 02/10/2016 1009   BILITOT 0.54 02/10/2016 1009       Lab Results  Component Value Date   WBC 4.7 02/10/2016   HGB 11.3 (L) 02/10/2016   HCT 32.8 (L) 02/10/2016   MCV 97.9 02/10/2016   PLT 322 02/10/2016   NEUTROABS 2.7 02/10/2016    ASSESSMENT & PLAN:  Breast cancer of upper-outer quadrant of left female breast ([place]) Left mastectomy 10/28/2015: IDC grade 3, 3 cm, lymphovascular invasion present, margins negative, 0/6 lymph nodes, ER 0%, PR 0%, HER-2 negative, Ki-67 60%, T2 N0 stage II a  Treatment plan: Adjuvant chemotherapy with dose dense Adriamycin and Cytoxan every 2 weeks 4 followed by weekly Taxol 12 No role of adjuvant radiation if the lymph nodes and margins are negative. No role for antiestrogen therapy since she is triple negative. ------------------------------------------------------------------------------------------------------------------------------------------------------------ Current treatment: Today's cycle 4Taxol. Echocardiogram 10/15/2015: EF  60-65% Labs have been reviewed thoroughly. Monitoring closely for chemotherapy toxicities  Chemotherapy toxicities: 1. Grade 3 neutropenia: We reducedthe dosage of cycle 2 of dose dense Adriamycin and Cytoxan 2. Nausea grade 2: She is tolerating Zofran better and has not required to much of it. 3. Fatigue grade 2: From day 3 today 5 profound fatigue. Gets better afterwards 4. Alopecia 5. Loss of taste and appetite: Patient needing 6-7 small meals every day I decreased the dosage of Taxol with cycle 4. I added Aloxi to her treatment.  Depression: I sent a prescription for Effexor.  Return to clinic in 2weeksfor Cycle 6 Taxol   No orders of the defined types were placed in this encounter.  The patient has a good understanding of the overall plan. she agrees with it. she will call with any problems that may develop before the next visit here.   Rulon Eisenmenger, MD 02/10/16

## 2016-02-16 ENCOUNTER — Other Ambulatory Visit: Payer: Self-pay | Admitting: *Deleted

## 2016-02-16 DIAGNOSIS — C50412 Malignant neoplasm of upper-outer quadrant of left female breast: Secondary | ICD-10-CM

## 2016-02-16 DIAGNOSIS — Z171 Estrogen receptor negative status [ER-]: Principal | ICD-10-CM

## 2016-02-17 ENCOUNTER — Other Ambulatory Visit (HOSPITAL_BASED_OUTPATIENT_CLINIC_OR_DEPARTMENT_OTHER): Payer: BLUE CROSS/BLUE SHIELD

## 2016-02-17 ENCOUNTER — Ambulatory Visit (HOSPITAL_BASED_OUTPATIENT_CLINIC_OR_DEPARTMENT_OTHER): Payer: BLUE CROSS/BLUE SHIELD

## 2016-02-17 ENCOUNTER — Encounter: Payer: Self-pay | Admitting: *Deleted

## 2016-02-17 VITALS — BP 103/68 | HR 85 | Temp 98.0°F | Resp 18

## 2016-02-17 DIAGNOSIS — Z5111 Encounter for antineoplastic chemotherapy: Secondary | ICD-10-CM

## 2016-02-17 DIAGNOSIS — C50412 Malignant neoplasm of upper-outer quadrant of left female breast: Secondary | ICD-10-CM | POA: Diagnosis not present

## 2016-02-17 DIAGNOSIS — Z171 Estrogen receptor negative status [ER-]: Principal | ICD-10-CM

## 2016-02-17 LAB — CBC WITH DIFFERENTIAL/PLATELET
BASO%: 2.4 % — AB (ref 0.0–2.0)
Basophils Absolute: 0.1 10*3/uL (ref 0.0–0.1)
EOS%: 4.9 % (ref 0.0–7.0)
Eosinophils Absolute: 0.3 10*3/uL (ref 0.0–0.5)
HCT: 33.3 % — ABNORMAL LOW (ref 34.8–46.6)
HGB: 11.1 g/dL — ABNORMAL LOW (ref 11.6–15.9)
LYMPH%: 12.6 % — AB (ref 14.0–49.7)
MCH: 33 pg (ref 25.1–34.0)
MCHC: 33.3 g/dL (ref 31.5–36.0)
MCV: 99.1 fL (ref 79.5–101.0)
MONO#: 1 10*3/uL — ABNORMAL HIGH (ref 0.1–0.9)
MONO%: 18.8 % — AB (ref 0.0–14.0)
NEUT%: 61.3 % (ref 38.4–76.8)
NEUTROS ABS: 3.1 10*3/uL (ref 1.5–6.5)
Platelets: 326 10*3/uL (ref 145–400)
RBC: 3.36 10*6/uL — AB (ref 3.70–5.45)
RDW: 16 % — ABNORMAL HIGH (ref 11.2–14.5)
WBC: 5.1 10*3/uL (ref 3.9–10.3)
lymph#: 0.6 10*3/uL — ABNORMAL LOW (ref 0.9–3.3)

## 2016-02-17 LAB — COMPREHENSIVE METABOLIC PANEL
ALT: 13 U/L (ref 0–55)
AST: 19 U/L (ref 5–34)
Albumin: 3.6 g/dL (ref 3.5–5.0)
Alkaline Phosphatase: 109 U/L (ref 40–150)
Anion Gap: 9 mEq/L (ref 3–11)
BILIRUBIN TOTAL: 0.44 mg/dL (ref 0.20–1.20)
BUN: 7.9 mg/dL (ref 7.0–26.0)
CO2: 26 meq/L (ref 22–29)
CREATININE: 0.6 mg/dL (ref 0.6–1.1)
Calcium: 9.5 mg/dL (ref 8.4–10.4)
Chloride: 100 mEq/L (ref 98–109)
GLUCOSE: 89 mg/dL (ref 70–140)
Potassium: 4 mEq/L (ref 3.5–5.1)
SODIUM: 135 meq/L — AB (ref 136–145)
TOTAL PROTEIN: 6.4 g/dL (ref 6.4–8.3)

## 2016-02-17 MED ORDER — PALONOSETRON HCL INJECTION 0.25 MG/5ML
0.2500 mg | Freq: Once | INTRAVENOUS | Status: AC
  Administered 2016-02-17: 0.25 mg via INTRAVENOUS

## 2016-02-17 MED ORDER — SODIUM CHLORIDE 0.9% FLUSH
10.0000 mL | INTRAVENOUS | Status: DC | PRN
  Administered 2016-02-17: 10 mL
  Filled 2016-02-17: qty 10

## 2016-02-17 MED ORDER — HEPARIN SOD (PORK) LOCK FLUSH 100 UNIT/ML IV SOLN
500.0000 [IU] | Freq: Once | INTRAVENOUS | Status: AC | PRN
  Administered 2016-02-17: 500 [IU]
  Filled 2016-02-17: qty 5

## 2016-02-17 MED ORDER — PACLITAXEL CHEMO INJECTION 300 MG/50ML
60.0000 mg/m2 | Freq: Once | INTRAVENOUS | Status: AC
  Administered 2016-02-17: 90 mg via INTRAVENOUS
  Filled 2016-02-17: qty 15

## 2016-02-17 MED ORDER — DEXAMETHASONE SODIUM PHOSPHATE 10 MG/ML IJ SOLN
INTRAMUSCULAR | Status: AC
  Filled 2016-02-17: qty 1

## 2016-02-17 MED ORDER — DEXAMETHASONE SODIUM PHOSPHATE 10 MG/ML IJ SOLN
10.0000 mg | Freq: Once | INTRAMUSCULAR | Status: AC
  Administered 2016-02-17: 10 mg via INTRAVENOUS

## 2016-02-17 MED ORDER — FAMOTIDINE IN NACL 20-0.9 MG/50ML-% IV SOLN
20.0000 mg | Freq: Once | INTRAVENOUS | Status: AC
  Administered 2016-02-17: 20 mg via INTRAVENOUS

## 2016-02-17 MED ORDER — DIPHENHYDRAMINE HCL 50 MG/ML IJ SOLN
25.0000 mg | Freq: Once | INTRAMUSCULAR | Status: AC
  Administered 2016-02-17: 25 mg via INTRAVENOUS

## 2016-02-17 MED ORDER — SODIUM CHLORIDE 0.9 % IV SOLN
Freq: Once | INTRAVENOUS | Status: AC
  Administered 2016-02-17: 11:00:00 via INTRAVENOUS

## 2016-02-17 MED ORDER — PALONOSETRON HCL INJECTION 0.25 MG/5ML
INTRAVENOUS | Status: AC
  Filled 2016-02-17: qty 5

## 2016-02-17 MED ORDER — DIPHENHYDRAMINE HCL 50 MG/ML IJ SOLN
INTRAMUSCULAR | Status: AC
  Filled 2016-02-17: qty 1

## 2016-02-17 MED ORDER — FAMOTIDINE IN NACL 20-0.9 MG/50ML-% IV SOLN
INTRAVENOUS | Status: AC
  Filled 2016-02-17: qty 50

## 2016-02-17 NOTE — Patient Instructions (Signed)
[facility] Discharge Instructions for Patients Receiving Chemotherapy  Today you received the following chemotherapy agents Taxol   To help prevent nausea and vomiting after your treatment, we encourage you to take your nausea medication as directed.   If you develop nausea and vomiting that is not controlled by your nausea medication, call the clinic.   BELOW ARE SYMPTOMS THAT SHOULD BE REPORTED IMMEDIATELY:  *FEVER GREATER THAN 100.5 F  *CHILLS WITH OR WITHOUT FEVER  NAUSEA AND VOMITING THAT IS NOT CONTROLLED WITH YOUR NAUSEA MEDICATION  *UNUSUAL SHORTNESS OF BREATH  *UNUSUAL BRUISING OR BLEEDING  TENDERNESS IN MOUTH AND THROAT WITH OR WITHOUT PRESENCE OF ULCERS  *URINARY PROBLEMS  *BOWEL PROBLEMS  UNUSUAL RASH Items with * indicate a potential emergency and should be followed up as soon as possible.  Feel free to call the clinic you have any questions or concerns. The clinic phone number is [phone].  Please show the CHEMO ALERT CARD at check-in to the Emergency Department and triage nurse.   

## 2016-02-17 NOTE — Progress Notes (Unsigned)
Spoke with patient in chemo today.  She states she is feeling much better than last week.  She more good days than bad. By Monday she was feeling better.  Encouraged her to call me this week if she needs anything.

## 2016-02-23 NOTE — Progress Notes (Signed)
Patient Care Team: Berkley Harvey, NP as PCP - General (Nurse Practitioner) Erroll Luna, MD as Consulting Physician (General Surgery) Nicholas Lose, MD as Consulting Physician (Hematology and Oncology) Kyung Rudd, MD as Consulting Physician (Radiation Oncology)  DIAGNOSIS:  Encounter Diagnosis  Name Primary?  . Malignant neoplasm of upper-outer quadrant of left breast in female, estrogen receptor negative ([place])     SUMMARY OF ONCOLOGIC HISTORY:   Breast cancer of upper-outer quadrant of left female breast ([place])   09/28/2000 Initial Biopsy    Left lumpectomy for stage I breast cancer that was ER/PR negative followed by adjuvant radiation      09/20/2015 Initial Diagnosis    Left breast mass 3 cm 1:00 position: IDC grade 2, ER/PR negative, Ki-67 60%, HER-2 negative, T2 N0 stage IIA clinical stage      10/28/2015 Surgery    Left mastectomy: IDC grade 3, 3 cm, lymphovascular invasion present, margins negative, 0/6 lymph nodes, ER 0%, PR 0%, HER-2 negative, Ki-67 60%, T2 N0 stage II a      11/25/2015 -  Chemotherapy    Adjuvant chemotherapy with Dose dense Adriamycin and Cytoxan 4 followed by Taxol 12        CHIEF COMPLIANT: Cycle 6 Taxol  INTERVAL HISTORY: Leah Mcdowell is a 61 year old with above-mentioned history of left breast cancer with lumpectomy followed by mastectomy. She is currently on adjuvant chemotherapy for triple negative disease. She is currently receiving Taxol and today is cycle 4 of Taxol. She has done fairly well from Taxol. She does have fatigue related to chemotherapy. Patient has decreased appetite from lack of taste. For depression we started her on Effexor and it appears to be helping her. She does not seem to be as depressed as previously. The number of days that she actually feels good has now increased to 3-4 days since we reduced the dosage. She is continuing to lose weight.  REVIEW OF SYSTEMS:   Constitutional: Denies fevers, chills, continuing to  lose weight Eyes: Denies blurriness of vision Ears, nose, mouth, throat, and face: Denies mucositis or sore throat Respiratory: Denies cough, dyspnea or wheezes Cardiovascular: Denies palpitation, chest discomfort Gastrointestinal:  Denies nausea, heartburn or change in bowel habits Skin: Denies abnormal skin rashes Lymphatics: Denies new lymphadenopathy or easy bruising Neurological:Denies numbness, tingling or new weaknesses Behavioral/Psych: Depression has improved Extremities: No lower extremity edema  All other systems were reviewed with the patient and are negative.  I have reviewed the past medical history, past surgical history, social history and family history with the patient and they are unchanged from previous note.  ALLERGIES:  is allergic to no known allergies.  MEDICATIONS:  Current Outpatient Prescriptions  Medication Sig Dispense Refill  . gabapentin (NEURONTIN) 300 MG capsule Take 300 mg by mouth 3 (three) times daily.    Marland Kitchen LORazepam (ATIVAN) 1 MG tablet Take 1 tablet (1 mg total) by mouth every 8 (eight) hours as needed for anxiety (or nausea). 30 tablet 0  . promethazine (PHENERGAN) 25 MG tablet Take 1 tablet (25 mg total) by mouth every 6 (six) hours as needed for nausea. 30 tablet 3  . venlafaxine XR (EFFEXOR-XR) 37.5 MG 24 hr capsule Take 1 capsule (37.5 mg total) by mouth daily with breakfast. 30 capsule 3   No current facility-administered medications for this visit.     PHYSICAL EXAMINATION: ECOG PERFORMANCE STATUS: 1 - Symptomatic but completely ambulatory  Vitals:   02/24/16 0933  BP: 127/74  Pulse: 97  Resp:  18  Temp: 97.5 F (36.4 C)   Filed Weights   02/24/16 0933  Weight: 98 lb 8 oz (44.7 kg)    GENERAL:alert, no distress and comfortable SKIN: skin color, texture, turgor are normal, no rashes or significant lesions EYES: normal, Conjunctiva are pink and non-injected, sclera clear OROPHARYNX:no exudate, no erythema and lips, buccal  mucosa, and tongue normal  NECK: supple, thyroid normal size, non-tender, without nodularity LYMPH:  no palpable lymphadenopathy in the cervical, axillary or inguinal LUNGS: clear to auscultation and percussion with normal breathing effort HEART: regular rate & rhythm and no murmurs and no lower extremity edema ABDOMEN:abdomen soft, non-tender and normal bowel sounds MUSCULOSKELETAL:no cyanosis of digits and no clubbing  NEURO: alert & oriented x 3 with fluent speech, no focal motor/sensory deficits EXTREMITIES: No lower extremity edema  LABORATORY DATA:  I have reviewed the data as listed   Chemistry      Component Value Date/Time   NA 136 02/24/2016 0921   K 4.1 02/24/2016 0921   CL 105 10/29/2015 0351   CO2 26 02/24/2016 0921   BUN 9.7 02/24/2016 0921   CREATININE 0.6 02/24/2016 0921      Component Value Date/Time   CALCIUM 9.8 02/24/2016 0921   ALKPHOS 114 02/24/2016 0921   AST 19 02/24/2016 0921   ALT 13 02/24/2016 0921   BILITOT 0.47 02/24/2016 0921       Lab Results  Component Value Date   WBC 5.4 02/24/2016   HGB 11.8 02/24/2016   HCT 34.1 (L) 02/24/2016   MCV 99.4 02/24/2016   PLT 396 02/24/2016   NEUTROABS 2.8 02/24/2016    ASSESSMENT & PLAN:  Breast cancer of upper-outer quadrant of left female breast ([place]) Left mastectomy 10/28/2015: IDC grade 3, 3 cm, lymphovascular invasion present, margins negative, 0/6 lymph nodes, ER 0%, PR 0%, HER-2 negative, Ki-67 60%, T2 N0 stage II a  Treatment plan: Adjuvant chemotherapy with dose dense Adriamycin and Cytoxan every 2 weeks 4 followed by weekly Taxol 12 No role of adjuvant radiation if the lymph nodes and margins are negative. No role for antiestrogen therapy since she is triple negative. ------------------------------------------------------------------------------------------------------------------------------------------------------------ Current treatment: Today's cycle 6Taxol. Echocardiogram 10/15/2015:  EF 60-65% Labs have been reviewed thoroughly. Monitoring closely for chemotherapy toxicities  Chemotherapy toxicities: 1. Grade 3 neutropenia: We reducedthe dosage of cycle 2 of dose dense Adriamycin and Cytoxan 2. Nausea grade 2: She is tolerating Zofran better and has not required to much of it. 3. Fatigue grade 2: From day 3 today 5 profound fatigue. Gets better afterwards 4. Alopecia 5. Loss of taste and appetite: Patient needing 6-7 small meals every day 6. Weight loss: Patient is working with our dietitian  I decreased the dosage of Taxol with cycle 4. I added Aloxi to her treatment.  Depression: OnEffexor.  Return to clinic in 2weeksfor Cycle 8 Taxol     No orders of the defined types were placed in this encounter.  The patient has a good understanding of the overall plan. she agrees with it. she will call with any problems that may develop before the next visit here.   Rulon Eisenmenger, MD 02/24/16

## 2016-02-23 NOTE — Assessment & Plan Note (Signed)
Left mastectomy 10/28/2015: IDC grade 3, 3 cm, lymphovascular invasion present, margins negative, 0/6 lymph nodes, ER 0%, PR 0%, HER-2 negative, Ki-67 60%, T2 N0 stage II a  Treatment plan: Adjuvant chemotherapy with dose dense Adriamycin and Cytoxan every 2 weeks 4 followed by weekly Taxol 12 No role of adjuvant radiation if the lymph nodes and margins are negative. No role for antiestrogen therapy since she is triple negative. ------------------------------------------------------------------------------------------------------------------------------------------------------------ Current treatment: Today's cycle 6Taxol. Echocardiogram 10/15/2015: EF 60-65% Labs have been reviewed thoroughly. Monitoring closely for chemotherapy toxicities  Chemotherapy toxicities: 1. Grade 3 neutropenia: We reducedthe dosage of cycle 2 of dose dense Adriamycin and Cytoxan 2. Nausea grade 2: She is tolerating Zofran better and has not required to much of it. 3. Fatigue grade 2: From day 3 today 5 profound fatigue. Gets better afterwards 4. Alopecia 5. Loss of taste and appetite: Patient needing 6-7 small meals every day I decreased the dosage of Taxol with cycle 4. I added Aloxi to her treatment.  Depression: I sent a prescription for Effexor.  Return to clinic in 2weeksfor Cycle 8 Taxol

## 2016-02-24 ENCOUNTER — Ambulatory Visit (HOSPITAL_BASED_OUTPATIENT_CLINIC_OR_DEPARTMENT_OTHER): Payer: BLUE CROSS/BLUE SHIELD | Admitting: Hematology and Oncology

## 2016-02-24 ENCOUNTER — Encounter: Payer: Self-pay | Admitting: Hematology and Oncology

## 2016-02-24 ENCOUNTER — Other Ambulatory Visit (HOSPITAL_BASED_OUTPATIENT_CLINIC_OR_DEPARTMENT_OTHER): Payer: BLUE CROSS/BLUE SHIELD

## 2016-02-24 ENCOUNTER — Other Ambulatory Visit: Payer: Self-pay | Admitting: Emergency Medicine

## 2016-02-24 ENCOUNTER — Ambulatory Visit (HOSPITAL_BASED_OUTPATIENT_CLINIC_OR_DEPARTMENT_OTHER): Payer: BLUE CROSS/BLUE SHIELD

## 2016-02-24 DIAGNOSIS — C50412 Malignant neoplasm of upper-outer quadrant of left female breast: Secondary | ICD-10-CM | POA: Diagnosis not present

## 2016-02-24 DIAGNOSIS — Z171 Estrogen receptor negative status [ER-]: Principal | ICD-10-CM

## 2016-02-24 DIAGNOSIS — F329 Major depressive disorder, single episode, unspecified: Secondary | ICD-10-CM

## 2016-02-24 DIAGNOSIS — R430 Anosmia: Secondary | ICD-10-CM

## 2016-02-24 DIAGNOSIS — R634 Abnormal weight loss: Secondary | ICD-10-CM

## 2016-02-24 DIAGNOSIS — L658 Other specified nonscarring hair loss: Secondary | ICD-10-CM

## 2016-02-24 DIAGNOSIS — Z5111 Encounter for antineoplastic chemotherapy: Secondary | ICD-10-CM

## 2016-02-24 DIAGNOSIS — D701 Agranulocytosis secondary to cancer chemotherapy: Secondary | ICD-10-CM

## 2016-02-24 DIAGNOSIS — R439 Unspecified disturbances of smell and taste: Secondary | ICD-10-CM

## 2016-02-24 DIAGNOSIS — R530 Neoplastic (malignant) related fatigue: Secondary | ICD-10-CM

## 2016-02-24 DIAGNOSIS — R630 Anorexia: Secondary | ICD-10-CM

## 2016-02-24 LAB — COMPREHENSIVE METABOLIC PANEL
ALT: 13 U/L (ref 0–55)
ANION GAP: 9 meq/L (ref 3–11)
AST: 19 U/L (ref 5–34)
Albumin: 3.7 g/dL (ref 3.5–5.0)
Alkaline Phosphatase: 114 U/L (ref 40–150)
BILIRUBIN TOTAL: 0.47 mg/dL (ref 0.20–1.20)
BUN: 9.7 mg/dL (ref 7.0–26.0)
CHLORIDE: 101 meq/L (ref 98–109)
CO2: 26 meq/L (ref 22–29)
Calcium: 9.8 mg/dL (ref 8.4–10.4)
Creatinine: 0.6 mg/dL (ref 0.6–1.1)
GLUCOSE: 103 mg/dL (ref 70–140)
Potassium: 4.1 mEq/L (ref 3.5–5.1)
SODIUM: 136 meq/L (ref 136–145)
TOTAL PROTEIN: 6.4 g/dL (ref 6.4–8.3)

## 2016-02-24 LAB — CBC WITH DIFFERENTIAL/PLATELET
BASO%: 2.1 % — ABNORMAL HIGH (ref 0.0–2.0)
Basophils Absolute: 0.1 10*3/uL (ref 0.0–0.1)
EOS ABS: 0.3 10*3/uL (ref 0.0–0.5)
EOS%: 5.4 % (ref 0.0–7.0)
HCT: 34.1 % — ABNORMAL LOW (ref 34.8–46.6)
HGB: 11.8 g/dL (ref 11.6–15.9)
LYMPH%: 21.1 % (ref 14.0–49.7)
MCH: 34.2 pg — ABNORMAL HIGH (ref 25.1–34.0)
MCHC: 34.4 g/dL (ref 31.5–36.0)
MCV: 99.4 fL (ref 79.5–101.0)
MONO#: 1.1 10*3/uL — ABNORMAL HIGH (ref 0.1–0.9)
MONO%: 19.7 % — AB (ref 0.0–14.0)
NEUT%: 51.7 % (ref 38.4–76.8)
NEUTROS ABS: 2.8 10*3/uL (ref 1.5–6.5)
Platelets: 396 10*3/uL (ref 145–400)
RBC: 3.43 10*6/uL — AB (ref 3.70–5.45)
RDW: 16.5 % — ABNORMAL HIGH (ref 11.2–14.5)
WBC: 5.4 10*3/uL (ref 3.9–10.3)
lymph#: 1.1 10*3/uL (ref 0.9–3.3)

## 2016-02-24 MED ORDER — DIPHENHYDRAMINE HCL 50 MG/ML IJ SOLN
INTRAMUSCULAR | Status: AC
  Filled 2016-02-24: qty 1

## 2016-02-24 MED ORDER — SODIUM CHLORIDE 0.9 % IV SOLN
Freq: Once | INTRAVENOUS | Status: AC
  Administered 2016-02-24: 11:00:00 via INTRAVENOUS

## 2016-02-24 MED ORDER — DEXAMETHASONE SODIUM PHOSPHATE 10 MG/ML IJ SOLN
INTRAMUSCULAR | Status: AC
  Filled 2016-02-24: qty 1

## 2016-02-24 MED ORDER — PALONOSETRON HCL INJECTION 0.25 MG/5ML
INTRAVENOUS | Status: AC
  Filled 2016-02-24: qty 5

## 2016-02-24 MED ORDER — HEPARIN SOD (PORK) LOCK FLUSH 100 UNIT/ML IV SOLN
500.0000 [IU] | Freq: Once | INTRAVENOUS | Status: AC | PRN
  Administered 2016-02-24: 500 [IU]
  Filled 2016-02-24: qty 5

## 2016-02-24 MED ORDER — PALONOSETRON HCL INJECTION 0.25 MG/5ML
0.2500 mg | Freq: Once | INTRAVENOUS | Status: AC
  Administered 2016-02-24: 0.25 mg via INTRAVENOUS

## 2016-02-24 MED ORDER — SODIUM CHLORIDE 0.9% FLUSH
10.0000 mL | INTRAVENOUS | Status: DC | PRN
  Administered 2016-02-24: 10 mL
  Filled 2016-02-24: qty 10

## 2016-02-24 MED ORDER — DEXAMETHASONE SODIUM PHOSPHATE 10 MG/ML IJ SOLN
10.0000 mg | Freq: Once | INTRAMUSCULAR | Status: AC
  Administered 2016-02-24: 10 mg via INTRAVENOUS

## 2016-02-24 MED ORDER — FAMOTIDINE IN NACL 20-0.9 MG/50ML-% IV SOLN
INTRAVENOUS | Status: AC
  Filled 2016-02-24: qty 50

## 2016-02-24 MED ORDER — PACLITAXEL CHEMO INJECTION 300 MG/50ML
60.0000 mg/m2 | Freq: Once | INTRAVENOUS | Status: AC
  Administered 2016-02-24: 90 mg via INTRAVENOUS
  Filled 2016-02-24: qty 15

## 2016-02-24 MED ORDER — DIPHENHYDRAMINE HCL 50 MG/ML IJ SOLN
25.0000 mg | Freq: Once | INTRAMUSCULAR | Status: AC
  Administered 2016-02-24: 25 mg via INTRAVENOUS

## 2016-02-24 MED ORDER — FAMOTIDINE IN NACL 20-0.9 MG/50ML-% IV SOLN
20.0000 mg | Freq: Once | INTRAVENOUS | Status: AC
  Administered 2016-02-24: 20 mg via INTRAVENOUS

## 2016-02-24 NOTE — Patient Instructions (Signed)
[facility] Discharge Instructions for Patients Receiving Chemotherapy  Today you received the following chemotherapy agents Taxol   To help prevent nausea and vomiting after your treatment, we encourage you to take your nausea medication as directed.   If you develop nausea and vomiting that is not controlled by your nausea medication, call the clinic.   BELOW ARE SYMPTOMS THAT SHOULD BE REPORTED IMMEDIATELY:  *FEVER GREATER THAN 100.5 F  *CHILLS WITH OR WITHOUT FEVER  NAUSEA AND VOMITING THAT IS NOT CONTROLLED WITH YOUR NAUSEA MEDICATION  *UNUSUAL SHORTNESS OF BREATH  *UNUSUAL BRUISING OR BLEEDING  TENDERNESS IN MOUTH AND THROAT WITH OR WITHOUT PRESENCE OF ULCERS  *URINARY PROBLEMS  *BOWEL PROBLEMS  UNUSUAL RASH Items with * indicate a potential emergency and should be followed up as soon as possible.  Feel free to call the clinic you have any questions or concerns. The clinic phone number is [phone].  Please show the CHEMO ALERT CARD at check-in to the Emergency Department and triage nurse.   

## 2016-02-25 NOTE — Addendum Note (Signed)
Addended by: Jaci Carrel A on: 02/25/2016 10:48 AM   Modules accepted: Orders

## 2016-03-01 ENCOUNTER — Other Ambulatory Visit: Payer: Self-pay | Admitting: Emergency Medicine

## 2016-03-01 DIAGNOSIS — C50412 Malignant neoplasm of upper-outer quadrant of left female breast: Secondary | ICD-10-CM

## 2016-03-01 DIAGNOSIS — Z171 Estrogen receptor negative status [ER-]: Principal | ICD-10-CM

## 2016-03-02 ENCOUNTER — Encounter: Payer: Self-pay | Admitting: *Deleted

## 2016-03-02 ENCOUNTER — Ambulatory Visit: Payer: BLUE CROSS/BLUE SHIELD | Admitting: Nutrition

## 2016-03-02 ENCOUNTER — Ambulatory Visit (HOSPITAL_BASED_OUTPATIENT_CLINIC_OR_DEPARTMENT_OTHER): Payer: BLUE CROSS/BLUE SHIELD

## 2016-03-02 ENCOUNTER — Other Ambulatory Visit (HOSPITAL_BASED_OUTPATIENT_CLINIC_OR_DEPARTMENT_OTHER): Payer: BLUE CROSS/BLUE SHIELD

## 2016-03-02 VITALS — BP 100/75 | HR 87 | Temp 98.1°F | Resp 16

## 2016-03-02 DIAGNOSIS — C50412 Malignant neoplasm of upper-outer quadrant of left female breast: Secondary | ICD-10-CM

## 2016-03-02 DIAGNOSIS — Z171 Estrogen receptor negative status [ER-]: Principal | ICD-10-CM

## 2016-03-02 DIAGNOSIS — Z5111 Encounter for antineoplastic chemotherapy: Secondary | ICD-10-CM | POA: Diagnosis not present

## 2016-03-02 LAB — CBC WITH DIFFERENTIAL/PLATELET
BASO%: 2.3 % — ABNORMAL HIGH (ref 0.0–2.0)
Basophils Absolute: 0.1 10*3/uL (ref 0.0–0.1)
EOS ABS: 0.2 10*3/uL (ref 0.0–0.5)
EOS%: 4.1 % (ref 0.0–7.0)
HCT: 36.9 % (ref 34.8–46.6)
HEMOGLOBIN: 12.2 g/dL (ref 11.6–15.9)
LYMPH%: 20.5 % (ref 14.0–49.7)
MCH: 32.8 pg (ref 25.1–34.0)
MCHC: 33.1 g/dL (ref 31.5–36.0)
MCV: 99.2 fL (ref 79.5–101.0)
MONO#: 0.8 10*3/uL (ref 0.1–0.9)
MONO%: 16.8 % — ABNORMAL HIGH (ref 0.0–14.0)
NEUT%: 56.3 % (ref 38.4–76.8)
NEUTROS ABS: 2.7 10*3/uL (ref 1.5–6.5)
PLATELETS: 337 10*3/uL (ref 145–400)
RBC: 3.72 10*6/uL (ref 3.70–5.45)
RDW: 14.9 % — AB (ref 11.2–14.5)
WBC: 4.8 10*3/uL (ref 3.9–10.3)
lymph#: 1 10*3/uL (ref 0.9–3.3)

## 2016-03-02 LAB — COMPREHENSIVE METABOLIC PANEL
ALBUMIN: 3.7 g/dL (ref 3.5–5.0)
ALK PHOS: 113 U/L (ref 40–150)
ALT: 12 U/L (ref 0–55)
ANION GAP: 8 meq/L (ref 3–11)
AST: 19 U/L (ref 5–34)
BILIRUBIN TOTAL: 0.34 mg/dL (ref 0.20–1.20)
BUN: 11.6 mg/dL (ref 7.0–26.0)
CO2: 27 mEq/L (ref 22–29)
Calcium: 9.6 mg/dL (ref 8.4–10.4)
Chloride: 102 mEq/L (ref 98–109)
Creatinine: 0.6 mg/dL (ref 0.6–1.1)
Glucose: 99 mg/dl (ref 70–140)
Potassium: 4.1 mEq/L (ref 3.5–5.1)
Sodium: 137 mEq/L (ref 136–145)
Total Protein: 6.4 g/dL (ref 6.4–8.3)

## 2016-03-02 MED ORDER — PACLITAXEL CHEMO INJECTION 300 MG/50ML
60.0000 mg/m2 | Freq: Once | INTRAVENOUS | Status: AC
  Administered 2016-03-02: 90 mg via INTRAVENOUS
  Filled 2016-03-02: qty 15

## 2016-03-02 MED ORDER — HEPARIN SOD (PORK) LOCK FLUSH 100 UNIT/ML IV SOLN
500.0000 [IU] | Freq: Once | INTRAVENOUS | Status: AC | PRN
  Administered 2016-03-02: 500 [IU]
  Filled 2016-03-02: qty 5

## 2016-03-02 MED ORDER — DIPHENHYDRAMINE HCL 50 MG/ML IJ SOLN
INTRAMUSCULAR | Status: AC
  Filled 2016-03-02: qty 1

## 2016-03-02 MED ORDER — DEXAMETHASONE SODIUM PHOSPHATE 10 MG/ML IJ SOLN
10.0000 mg | Freq: Once | INTRAMUSCULAR | Status: AC
  Administered 2016-03-02: 10 mg via INTRAVENOUS

## 2016-03-02 MED ORDER — SODIUM CHLORIDE 0.9% FLUSH
10.0000 mL | INTRAVENOUS | Status: DC | PRN
  Administered 2016-03-02: 10 mL
  Filled 2016-03-02: qty 10

## 2016-03-02 MED ORDER — SODIUM CHLORIDE 0.9 % IV SOLN
Freq: Once | INTRAVENOUS | Status: AC
  Administered 2016-03-02: 11:00:00 via INTRAVENOUS

## 2016-03-02 MED ORDER — PALONOSETRON HCL INJECTION 0.25 MG/5ML
0.2500 mg | Freq: Once | INTRAVENOUS | Status: AC
  Administered 2016-03-02: 0.25 mg via INTRAVENOUS

## 2016-03-02 MED ORDER — DIPHENHYDRAMINE HCL 50 MG/ML IJ SOLN
25.0000 mg | Freq: Once | INTRAMUSCULAR | Status: AC
  Administered 2016-03-02: 25 mg via INTRAVENOUS

## 2016-03-02 MED ORDER — PALONOSETRON HCL INJECTION 0.25 MG/5ML
INTRAVENOUS | Status: AC
  Filled 2016-03-02: qty 5

## 2016-03-02 MED ORDER — FAMOTIDINE IN NACL 20-0.9 MG/50ML-% IV SOLN
20.0000 mg | Freq: Once | INTRAVENOUS | Status: AC
  Administered 2016-03-02: 20 mg via INTRAVENOUS

## 2016-03-02 MED ORDER — FAMOTIDINE IN NACL 20-0.9 MG/50ML-% IV SOLN
INTRAVENOUS | Status: AC
  Filled 2016-03-02: qty 50

## 2016-03-02 MED ORDER — DEXAMETHASONE SODIUM PHOSPHATE 10 MG/ML IJ SOLN
INTRAMUSCULAR | Status: AC
  Filled 2016-03-02: qty 1

## 2016-03-02 NOTE — Progress Notes (Signed)
Nutrition follow-up completed with patient receiving treatment for breast cancer. Patient's weight decreased and documented as 98.5 pounds January 4, down from 100.3 pounds. Labs were reviewed. Patient reports her appetite is a little better. She tries to drink one [employer] breakfast essentials daily. Denies questions or concerns.  Nutrition diagnosis: Underweight continues.  Intervention: Educated patient to continue [employer] essentials 1-2 daily. Recommended high-calorie, high-protein foods throughout the day. Teach back method was used.  Monitoring, evaluation, goals:  Patient will tolerate increased calories and protein to minimize further weight loss.  Next visit: Thursday, January 25, during infusion.  **Disclaimer: This note was dictated with voice recognition software. Similar sounding words can inadvertently be transcribed and this note may contain transcription errors which may not have been corrected upon publication of note.**

## 2016-03-02 NOTE — Patient Instructions (Signed)
[facility] Discharge Instructions for Patients Receiving Chemotherapy  Today you received the following chemotherapy agents Taxol   To help prevent nausea and vomiting after your treatment, we encourage you to take your nausea medication as directed.   If you develop nausea and vomiting that is not controlled by your nausea medication, call the clinic.   BELOW ARE SYMPTOMS THAT SHOULD BE REPORTED IMMEDIATELY:  *FEVER GREATER THAN 100.5 F  *CHILLS WITH OR WITHOUT FEVER  NAUSEA AND VOMITING THAT IS NOT CONTROLLED WITH YOUR NAUSEA MEDICATION  *UNUSUAL SHORTNESS OF BREATH  *UNUSUAL BRUISING OR BLEEDING  TENDERNESS IN MOUTH AND THROAT WITH OR WITHOUT PRESENCE OF ULCERS  *URINARY PROBLEMS  *BOWEL PROBLEMS  UNUSUAL RASH Items with * indicate a potential emergency and should be followed up as soon as possible.  Feel free to call the clinic you have any questions or concerns. The clinic phone number is [phone].  Please show the CHEMO ALERT CARD at check-in to the Emergency Department and triage nurse.   

## 2016-03-08 ENCOUNTER — Other Ambulatory Visit: Payer: Self-pay

## 2016-03-08 DIAGNOSIS — Z171 Estrogen receptor negative status [ER-]: Principal | ICD-10-CM

## 2016-03-08 DIAGNOSIS — C50412 Malignant neoplasm of upper-outer quadrant of left female breast: Secondary | ICD-10-CM

## 2016-03-09 ENCOUNTER — Ambulatory Visit: Payer: BLUE CROSS/BLUE SHIELD

## 2016-03-09 ENCOUNTER — Telehealth: Payer: Self-pay | Admitting: Hematology and Oncology

## 2016-03-09 ENCOUNTER — Other Ambulatory Visit: Payer: BLUE CROSS/BLUE SHIELD

## 2016-03-09 ENCOUNTER — Telehealth: Payer: Self-pay | Admitting: *Deleted

## 2016-03-09 ENCOUNTER — Ambulatory Visit: Payer: BLUE CROSS/BLUE SHIELD | Admitting: Hematology and Oncology

## 2016-03-09 NOTE — Assessment & Plan Note (Deleted)
Left mastectomy 10/28/2015: IDC grade 3, 3 cm, lymphovascular invasion present, margins negative, 0/6 lymph nodes, ER 0%, PR 0%, HER-2 negative, Ki-67 60%, T2 N0 stage II a  Treatment plan: Adjuvant chemotherapy with dose dense Adriamycin and Cytoxan every 2 weeks 4 followed by weekly Taxol 12 No role of adjuvant radiation if the lymph nodes and margins are negative. No role for antiestrogen therapy since she is triple negative. ------------------------------------------------------------------------------------------------------------------------------------------------------------ Current treatment: Today's cycle 8Taxol. Echocardiogram 10/15/2015: EF 60-65% Labs have been reviewed thoroughly. Monitoring closely for chemotherapy toxicities  Chemotherapy toxicities: 1. Grade 3 neutropenia: We reducedthe dosage of cycle 2 of dose dense Adriamycin and Cytoxan 2. Nausea grade 2: She is tolerating Zofran better and has not required to much of it. 3. Fatigue grade 2: From day 3 today 5 profound fatigue. Gets better afterwards 4. Alopecia 5. Loss of taste and appetite: Patient needing 6-7 small meals every day 6. Weight loss: Patient is working with our dietitian  I decreased the dosage of Taxol with cycle 4. I added Aloxi to her treatment.  Depression: OnEffexor.  Return to clinic in 2weeksfor Cycle 10Taxol

## 2016-03-09 NOTE — Telephone Encounter (Signed)
Patient called to cancel her appointment today and needs to reschedule due to the icy roads.  Call back # is [phone] or cell [phone]

## 2016-03-09 NOTE — Progress Notes (Deleted)
Patient Care Team: Berkley Harvey, NP as PCP - General (Nurse Practitioner) Erroll Luna, MD as Consulting Physician (General Surgery) Nicholas Lose, MD as Consulting Physician (Hematology and Oncology) Kyung Rudd, MD as Consulting Physician (Radiation Oncology)  DIAGNOSIS:  Encounter Diagnosis  Name Primary?  . Malignant neoplasm of upper-outer quadrant of left breast in female, estrogen receptor negative ([place])     SUMMARY OF ONCOLOGIC HISTORY:   Breast cancer of upper-outer quadrant of left female breast ([place])   09/28/2000 Initial Biopsy    Left lumpectomy for stage I breast cancer that was ER/PR negative followed by adjuvant radiation      09/20/2015 Initial Diagnosis    Left breast mass 3 cm 1:00 position: IDC grade 2, ER/PR negative, Ki-67 60%, HER-2 negative, T2 N0 stage IIA clinical stage      10/28/2015 Surgery    Left mastectomy: IDC grade 3, 3 cm, lymphovascular invasion present, margins negative, 0/6 lymph nodes, ER 0%, PR 0%, HER-2 negative, Ki-67 60%, T2 N0 stage II a      11/25/2015 -  Chemotherapy    Adjuvant chemotherapy with Dose dense Adriamycin and Cytoxan 4 followed by Taxol 12        CHIEF COMPLIANT: cycle 6 Taxol  INTERVAL HISTORY: Leah Mcdowell is a 61 year old with above-mentioned history of left breast cancer treated with mastectomy and is currently on adjuvant chemotherapy. She is here to receive cycle 6 of Taxol. We had to reduce the dosage of Taxol with cycle 4. She has had profound loss of taste and appetite and weight loss. She also complains of severe fatigue. Nausea has been well controlled. We are carefully watching her blood counts as well  REVIEW OF SYSTEMS:   Constitutional: Denies fevers, chills or abnormal weight loss, severe fatigue and weight loss Eyes: Denies blurriness of vision Ears, nose, mouth, throat, and face: Denies mucositis or sore throat Respiratory: Denies cough, dyspnea or wheezes Cardiovascular: Denies palpitation, chest  discomfort Gastrointestinal:  Denies nausea, heartburn or change in bowel habits Skin: Denies abnormal skin rashes Lymphatics: Denies new lymphadenopathy or easy bruising Neurological:Denies numbness, tingling or new weaknesses Behavioral/Psych: Mood is stable, no new changes  Extremities: No lower extremity edema Breast:  denies any pain or lumps or nodules in either breasts All other systems were reviewed with the patient and are negative.  I have reviewed the past medical history, past surgical history, social history and family history with the patient and they are unchanged from previous note.  ALLERGIES:  is allergic to no known allergies.  MEDICATIONS:  Current Outpatient Prescriptions  Medication Sig Dispense Refill  . LORazepam (ATIVAN) 1 MG tablet Take 1 tablet (1 mg total) by mouth every 8 (eight) hours as needed for anxiety (or nausea). 30 tablet 0  . promethazine (PHENERGAN) 25 MG tablet Take 1 tablet (25 mg total) by mouth every 6 (six) hours as needed for nausea. 30 tablet 3  . venlafaxine XR (EFFEXOR-XR) 37.5 MG 24 hr capsule Take 1 capsule (37.5 mg total) by mouth daily with breakfast. 30 capsule 3   No current facility-administered medications for this visit.     PHYSICAL EXAMINATION: ECOG PERFORMANCE STATUS: 1 - Symptomatic but completely ambulatory  There were no vitals filed for this visit. There were no vitals filed for this visit.  GENERAL:alert, no distress and comfortable SKIN: skin color, texture, turgor are normal, no rashes or significant lesions EYES: normal, Conjunctiva are pink and non-injected, sclera clear OROPHARYNX:no exudate, no erythema and lips,  buccal mucosa, and tongue normal  NECK: supple, thyroid normal size, non-tender, without nodularity LYMPH:  no palpable lymphadenopathy in the cervical, axillary or inguinal LUNGS: clear to auscultation and percussion with normal breathing effort HEART: regular rate & rhythm and no murmurs and no  lower extremity edema ABDOMEN:abdomen soft, non-tender and normal bowel sounds MUSCULOSKELETAL:no cyanosis of digits and no clubbing  NEURO: alert & oriented x 3 with fluent speech, no focal motor/sensory deficits EXTREMITIES: No lower extremity edema  LABORATORY DATA:  I have reviewed the data as listed   Chemistry      Component Value Date/Time   NA 137 03/02/2016 1022   K 4.1 03/02/2016 1022   CL 105 10/29/2015 0351   CO2 27 03/02/2016 1022   BUN 11.6 03/02/2016 1022   CREATININE 0.6 03/02/2016 1022      Component Value Date/Time   CALCIUM 9.6 03/02/2016 1022   ALKPHOS 113 03/02/2016 1022   AST 19 03/02/2016 1022   ALT 12 03/02/2016 1022   BILITOT 0.34 03/02/2016 1022       Lab Results  Component Value Date   WBC 4.8 03/02/2016   HGB 12.2 03/02/2016   HCT 36.9 03/02/2016   MCV 99.2 03/02/2016   PLT 337 03/02/2016   NEUTROABS 2.7 03/02/2016    ASSESSMENT & PLAN:  Breast cancer of upper-outer quadrant of left female breast ([place]) Left mastectomy 10/28/2015: IDC grade 3, 3 cm, lymphovascular invasion present, margins negative, 0/6 lymph nodes, ER 0%, PR 0%, HER-2 negative, Ki-67 60%, T2 N0 stage II a  Treatment plan: Adjuvant chemotherapy with dose dense Adriamycin and Cytoxan every 2 weeks 4 followed by weekly Taxol 12 No role of adjuvant radiation if the lymph nodes and margins are negative. No role for antiestrogen therapy since she is triple negative. ------------------------------------------------------------------------------------------------------------------------------------------------------------ Current treatment: Today's cycle 8Taxol. Echocardiogram 10/15/2015: EF 60-65% Labs have been reviewed thoroughly. Monitoring closely for chemotherapy toxicities  Chemotherapy toxicities: 1. Grade 3 neutropenia: We reducedthe dosage of cycle 2 of dose dense Adriamycin and Cytoxan 2. Nausea grade 2: She is tolerating Zofran better and has not required to  much of it. 3. Fatigue grade 2: From day 3 today 5 profound fatigue. Gets better afterwards 4. Alopecia 5. Loss of taste and appetite: Patient needing 6-7 small meals every day 6. Weight loss: Patient is working with our dietitian  I decreased the dosage of Taxol with cycle 4. I added Aloxi to her treatment.  Depression: OnEffexor.  Return to clinic in 2weeksfor Cycle 10Taxol     I spent 25 minutes talking to the patient of which more than half was spent in counseling and coordination of care.  No orders of the defined types were placed in this encounter.  The patient has a good understanding of the overall plan. she agrees with it. she will call with any problems that may develop before the next visit here.   Rulon Eisenmenger, MD 03/09/16

## 2016-03-09 NOTE — Telephone Encounter (Signed)
Spoke to patient to see if she wants to reschedule her appointment for tomorrow or wait till next week.  She states she could come in tomorrow.  Confirmed appointment for 03/10/16 for lab, MD, and chemo at 1215pm.

## 2016-03-09 NOTE — Assessment & Plan Note (Signed)
Left mastectomy 10/28/2015: IDC grade 3, 3 cm, lymphovascular invasion present, margins negative, 0/6 lymph nodes, ER 0%, PR 0%, HER-2 negative, Ki-67 60%, T2 N0 stage II a  Treatment plan: Adjuvant chemotherapy with dose dense Adriamycin and Cytoxan every 2 weeks 4 followed by weekly Taxol 12 No role of adjuvant radiation if the lymph nodes and margins are negative. No role for antiestrogen therapy since she is triple negative. ------------------------------------------------------------------------------------------------------------------------------------------------------------ Current treatment: Today's cycle 8Taxol. Echocardiogram 10/15/2015: EF 60-65% Labs have been reviewed thoroughly. Monitoring closely for chemotherapy toxicities  Chemotherapy toxicities: 1. Grade 3 neutropenia: We reducedthe dosage of cycle 2 of dose dense Adriamycin and Cytoxan 2. Nausea grade 2: She is tolerating Zofran better and has not required to much of it. 3. Fatigue grade 2: From day 3 today 5 profound fatigue. Gets better afterwards 4. Alopecia 5. Loss of taste and appetite: Patient needing 6-7 small meals every day 6. Weight loss: Patient is working with our dietitian  I decreased the dosage of Taxol with cycle 4. I added Aloxi to her treatment.  Depression: OnEffexor.  Return to clinic in 2weeksfor Cycle 10Taxol   

## 2016-03-10 ENCOUNTER — Encounter: Payer: Self-pay | Admitting: Hematology and Oncology

## 2016-03-10 ENCOUNTER — Ambulatory Visit (HOSPITAL_BASED_OUTPATIENT_CLINIC_OR_DEPARTMENT_OTHER): Payer: BLUE CROSS/BLUE SHIELD | Admitting: Hematology and Oncology

## 2016-03-10 ENCOUNTER — Ambulatory Visit (HOSPITAL_BASED_OUTPATIENT_CLINIC_OR_DEPARTMENT_OTHER): Payer: BLUE CROSS/BLUE SHIELD

## 2016-03-10 ENCOUNTER — Other Ambulatory Visit (HOSPITAL_BASED_OUTPATIENT_CLINIC_OR_DEPARTMENT_OTHER): Payer: BLUE CROSS/BLUE SHIELD

## 2016-03-10 VITALS — HR 85

## 2016-03-10 DIAGNOSIS — L658 Other specified nonscarring hair loss: Secondary | ICD-10-CM

## 2016-03-10 DIAGNOSIS — R630 Anorexia: Secondary | ICD-10-CM | POA: Diagnosis not present

## 2016-03-10 DIAGNOSIS — Z171 Estrogen receptor negative status [ER-]: Secondary | ICD-10-CM

## 2016-03-10 DIAGNOSIS — Z5111 Encounter for antineoplastic chemotherapy: Secondary | ICD-10-CM | POA: Diagnosis not present

## 2016-03-10 DIAGNOSIS — C50412 Malignant neoplasm of upper-outer quadrant of left female breast: Secondary | ICD-10-CM

## 2016-03-10 DIAGNOSIS — R530 Neoplastic (malignant) related fatigue: Secondary | ICD-10-CM

## 2016-03-10 DIAGNOSIS — R438 Other disturbances of smell and taste: Secondary | ICD-10-CM

## 2016-03-10 LAB — CBC WITH DIFFERENTIAL/PLATELET
BASO%: 1.8 % (ref 0.0–2.0)
Basophils Absolute: 0.1 10*3/uL (ref 0.0–0.1)
EOS%: 2.7 % (ref 0.0–7.0)
Eosinophils Absolute: 0.1 10*3/uL (ref 0.0–0.5)
HEMATOCRIT: 36.7 % (ref 34.8–46.6)
HGB: 12.5 g/dL (ref 11.6–15.9)
LYMPH#: 1.2 10*3/uL (ref 0.9–3.3)
LYMPH%: 21.9 % (ref 14.0–49.7)
MCH: 34.2 pg — ABNORMAL HIGH (ref 25.1–34.0)
MCHC: 33.9 g/dL (ref 31.5–36.0)
MCV: 100.7 fL (ref 79.5–101.0)
MONO#: 0.9 10*3/uL (ref 0.1–0.9)
MONO%: 16.8 % — ABNORMAL HIGH (ref 0.0–14.0)
NEUT#: 3.1 10*3/uL (ref 1.5–6.5)
NEUT%: 56.8 % (ref 38.4–76.8)
Platelets: 393 10*3/uL (ref 145–400)
RBC: 3.65 10*6/uL — ABNORMAL LOW (ref 3.70–5.45)
RDW: 15.9 % — ABNORMAL HIGH (ref 11.2–14.5)
WBC: 5.4 10*3/uL (ref 3.9–10.3)

## 2016-03-10 LAB — COMPREHENSIVE METABOLIC PANEL
ALT: 13 U/L (ref 0–55)
AST: 18 U/L (ref 5–34)
Albumin: 3.8 g/dL (ref 3.5–5.0)
Alkaline Phosphatase: 110 U/L (ref 40–150)
Anion Gap: 9 mEq/L (ref 3–11)
BUN: 10.1 mg/dL (ref 7.0–26.0)
CHLORIDE: 105 meq/L (ref 98–109)
CO2: 25 meq/L (ref 22–29)
CREATININE: 0.7 mg/dL (ref 0.6–1.1)
Calcium: 9.6 mg/dL (ref 8.4–10.4)
EGFR: >90 mL/min/{1.73_m2} (ref >90)
GLUCOSE: 97 mg/dL (ref 70–140)
Potassium: 3.9 mEq/L (ref 3.5–5.1)
SODIUM: 139 meq/L (ref 136–145)
Total Bilirubin: 0.32 mg/dL (ref 0.20–1.20)
Total Protein: 6.4 g/dL (ref 6.4–8.3)

## 2016-03-10 MED ORDER — PALONOSETRON HCL INJECTION 0.25 MG/5ML
INTRAVENOUS | Status: AC
  Filled 2016-03-10: qty 5

## 2016-03-10 MED ORDER — SODIUM CHLORIDE 0.9% FLUSH
10.0000 mL | INTRAVENOUS | Status: DC | PRN
  Administered 2016-03-10: 10 mL
  Filled 2016-03-10: qty 10

## 2016-03-10 MED ORDER — FAMOTIDINE IN NACL 20-0.9 MG/50ML-% IV SOLN
20.0000 mg | Freq: Once | INTRAVENOUS | Status: AC
  Administered 2016-03-10: 20 mg via INTRAVENOUS

## 2016-03-10 MED ORDER — DEXAMETHASONE SODIUM PHOSPHATE 10 MG/ML IJ SOLN
10.0000 mg | Freq: Once | INTRAMUSCULAR | Status: AC
  Administered 2016-03-10: 10 mg via INTRAVENOUS

## 2016-03-10 MED ORDER — DIPHENHYDRAMINE HCL 50 MG/ML IJ SOLN
25.0000 mg | Freq: Once | INTRAMUSCULAR | Status: AC
  Administered 2016-03-10: 25 mg via INTRAVENOUS

## 2016-03-10 MED ORDER — FAMOTIDINE IN NACL 20-0.9 MG/50ML-% IV SOLN
INTRAVENOUS | Status: AC
  Filled 2016-03-10: qty 50

## 2016-03-10 MED ORDER — HEPARIN SOD (PORK) LOCK FLUSH 100 UNIT/ML IV SOLN
500.0000 [IU] | Freq: Once | INTRAVENOUS | Status: AC | PRN
  Administered 2016-03-10: 500 [IU]
  Filled 2016-03-10: qty 5

## 2016-03-10 MED ORDER — DIPHENHYDRAMINE HCL 50 MG/ML IJ SOLN
INTRAMUSCULAR | Status: AC
  Filled 2016-03-10: qty 1

## 2016-03-10 MED ORDER — PALONOSETRON HCL INJECTION 0.25 MG/5ML
0.2500 mg | Freq: Once | INTRAVENOUS | Status: AC
  Administered 2016-03-10: 0.25 mg via INTRAVENOUS

## 2016-03-10 MED ORDER — SODIUM CHLORIDE 0.9 % IV SOLN
Freq: Once | INTRAVENOUS | Status: AC
  Administered 2016-03-10: 13:00:00 via INTRAVENOUS

## 2016-03-10 MED ORDER — DEXAMETHASONE SODIUM PHOSPHATE 10 MG/ML IJ SOLN
INTRAMUSCULAR | Status: AC
  Filled 2016-03-10: qty 1

## 2016-03-10 MED ORDER — PACLITAXEL CHEMO INJECTION 300 MG/50ML
60.0000 mg/m2 | Freq: Once | INTRAVENOUS | Status: AC
  Administered 2016-03-10: 90 mg via INTRAVENOUS
  Filled 2016-03-10: qty 15

## 2016-03-10 NOTE — Progress Notes (Signed)
Patient Care Team: Berkley Harvey, NP as PCP - General (Nurse Practitioner) Erroll Luna, MD as Consulting Physician (General Surgery) Nicholas Lose, MD as Consulting Physician (Hematology and Oncology) Kyung Rudd, MD as Consulting Physician (Radiation Oncology)  DIAGNOSIS:  Encounter Diagnosis  Name Primary?  . Malignant neoplasm of upper-outer quadrant of left breast in female, estrogen receptor negative ([place])     SUMMARY OF ONCOLOGIC HISTORY:   Breast cancer of upper-outer quadrant of left female breast ([place])   09/28/2000 Initial Biopsy    Left lumpectomy for stage I breast cancer that was ER/PR negative followed by adjuvant radiation      09/20/2015 Initial Diagnosis    Left breast mass 3 cm 1:00 position: IDC grade 2, ER/PR negative, Ki-67 60%, HER-2 negative, T2 N0 stage IIA clinical stage      10/28/2015 Surgery    Left mastectomy: IDC grade 3, 3 cm, lymphovascular invasion present, margins negative, 0/6 lymph nodes, ER 0%, PR 0%, HER-2 negative, Ki-67 60%, T2 N0 stage II a      11/25/2015 -  Chemotherapy    Adjuvant chemotherapy with Dose dense Adriamycin and Cytoxan 4 followed by Taxol 12        CHIEF COMPLIANT: Follow-up on adjuvant Taxol cycle 8  INTERVAL HISTORY: Leah Mcdowell is a 61 year old with above-mentioned history of left breast cancer treated with mastectomy and is currently on adjuvant chemotherapy. Today is cycle 8 of Taxol. She has done quite well with the last cycle even though prior to that she was having tremendous trouble with fatigue and loss of appetite and weight. Her weight has stabilized. With Effexor her mood has stabilized. She is no longer depressed. She is still able to work but has to rest because of fatigue.  REVIEW OF SYSTEMS:   Constitutional: Denies fevers, chills or abnormal weight loss Eyes: Denies blurriness of vision Ears, nose, mouth, throat, and face: Denies mucositis or sore throat Respiratory: Denies cough, dyspnea or  wheezes Cardiovascular: Denies palpitation, chest discomfort Gastrointestinal:  Denies nausea, heartburn or change in bowel habits Skin: Denies abnormal skin rashes Lymphatics: Denies new lymphadenopathy or easy bruising Neurological:Denies numbness, tingling or new weaknesses Behavioral/Psych: Mood is stable, no new changes  Extremities: No lower extremity edema Breast:  denies any pain or lumps or nodules in either breasts All other systems were reviewed with the patient and are negative.  I have reviewed the past medical history, past surgical history, social history and family history with the patient and they are unchanged from previous note.  ALLERGIES:  is allergic to no known allergies.  MEDICATIONS:  Current Outpatient Prescriptions  Medication Sig Dispense Refill  . LORazepam (ATIVAN) 1 MG tablet Take 1 tablet (1 mg total) by mouth every 8 (eight) hours as needed for anxiety (or nausea). 30 tablet 0  . venlafaxine XR (EFFEXOR-XR) 37.5 MG 24 hr capsule Take 1 capsule (37.5 mg total) by mouth daily with breakfast. 30 capsule 3   No current facility-administered medications for this visit.     PHYSICAL EXAMINATION: ECOG PERFORMANCE STATUS: 1 - Symptomatic but completely ambulatory  Vitals:   03/10/16 1227  BP: 119/79  Pulse: (!) 107  Resp: 18  Temp: 97.8 F (36.6 C)   Filed Weights   03/10/16 1227  Weight: 98 lb 6.4 oz (44.6 kg)    GENERAL:alert, no distress and comfortable SKIN: skin color, texture, turgor are normal, no rashes or significant lesions EYES: normal, Conjunctiva are pink and non-injected, sclera clear OROPHARYNX:no exudate,  no erythema and lips, buccal mucosa, and tongue normal  NECK: supple, thyroid normal size, non-tender, without nodularity LYMPH:  no palpable lymphadenopathy in the cervical, axillary or inguinal LUNGS: clear to auscultation and percussion with normal breathing effort HEART: regular rate & rhythm and no murmurs and no lower  extremity edema ABDOMEN:abdomen soft, non-tender and normal bowel sounds MUSCULOSKELETAL:no cyanosis of digits and no clubbing  NEURO: alert & oriented x 3 with fluent speech, no focal motor/sensory deficits EXTREMITIES: No lower extremity edema   LABORATORY DATA:  I have reviewed the data as listed   Chemistry      Component Value Date/Time   NA 139 03/10/2016 1204   K 3.9 03/10/2016 1204   CL 105 10/29/2015 0351   CO2 25 03/10/2016 1204   BUN 10.1 03/10/2016 1204   CREATININE 0.7 03/10/2016 1204      Component Value Date/Time   CALCIUM 9.6 03/10/2016 1204   ALKPHOS 110 03/10/2016 1204   AST 18 03/10/2016 1204   ALT 13 03/10/2016 1204   BILITOT 0.32 03/10/2016 1204       Lab Results  Component Value Date   WBC 5.4 03/10/2016   HGB 12.5 03/10/2016   HCT 36.7 03/10/2016   MCV 100.7 03/10/2016   PLT 393 03/10/2016   NEUTROABS 3.1 03/10/2016    ASSESSMENT & PLAN:  Breast cancer of upper-outer quadrant of left female breast ([place]) Left mastectomy 10/28/2015: IDC grade 3, 3 cm, lymphovascular invasion present, margins negative, 0/6 lymph nodes, ER 0%, PR 0%, HER-2 negative, Ki-67 60%, T2 N0 stage II a  Treatment plan: Adjuvant chemotherapy with dose dense Adriamycin and Cytoxan every 2 weeks 4 followed by weekly Taxol 12 No role of adjuvant radiation if the lymph nodes and margins are negative. No role for antiestrogen therapy since she is triple negative. ------------------------------------------------------------------------------------------------------------------------------------------------------------ Current treatment: Today's cycle 8Taxol. Echocardiogram 10/15/2015: EF 60-65% Labs have been reviewed thoroughly. Monitoring closely for chemotherapy toxicities  Chemotherapy toxicities: 1. Grade 3 neutropenia: We reducedthe dosage of cycle 2 of dose dense Adriamycin and Cytoxan 2. Nausea grade 2: She is tolerating Zofran better and has not required to much  of it. 3. Fatigue grade 2: From day 3 today 5 profound fatigue. Gets better afterwards 4. Alopecia 5. Loss of taste and appetite: Patient needing 6-7 small meals every day 6. Weight loss: Patient is working with our dietitian  I decreased the dosage of Taxol with cycle 4. I added Aloxi to her treatment.  Depression: OnEffexor.  Return to clinic in 2weeksfor Cycle 10Taxol     I spent 25 minutes talking to the patient of which more than half was spent in counseling and coordination of care.  No orders of the defined types were placed in this encounter.  The patient has a good understanding of the overall plan. she agrees with it. she will call with any problems that may develop before the next visit here.   Rulon Eisenmenger, MD 03/10/16

## 2016-03-10 NOTE — Patient Instructions (Signed)
[facility] Discharge Instructions for Patients Receiving Chemotherapy  Today you received the following chemotherapy agents Taxol   To help prevent nausea and vomiting after your treatment, we encourage you to take your nausea medication as directed.   If you develop nausea and vomiting that is not controlled by your nausea medication, call the clinic.   BELOW ARE SYMPTOMS THAT SHOULD BE REPORTED IMMEDIATELY:  *FEVER GREATER THAN 100.5 F  *CHILLS WITH OR WITHOUT FEVER  NAUSEA AND VOMITING THAT IS NOT CONTROLLED WITH YOUR NAUSEA MEDICATION  *UNUSUAL SHORTNESS OF BREATH  *UNUSUAL BRUISING OR BLEEDING  TENDERNESS IN MOUTH AND THROAT WITH OR WITHOUT PRESENCE OF ULCERS  *URINARY PROBLEMS  *BOWEL PROBLEMS  UNUSUAL RASH Items with * indicate a potential emergency and should be followed up as soon as possible.  Feel free to call the clinic you have any questions or concerns. The clinic phone number is [phone].  Please show the CHEMO ALERT CARD at check-in to the Emergency Department and triage nurse.   

## 2016-03-15 ENCOUNTER — Other Ambulatory Visit: Payer: Self-pay

## 2016-03-15 DIAGNOSIS — Z171 Estrogen receptor negative status [ER-]: Principal | ICD-10-CM

## 2016-03-15 DIAGNOSIS — C50412 Malignant neoplasm of upper-outer quadrant of left female breast: Secondary | ICD-10-CM

## 2016-03-16 ENCOUNTER — Ambulatory Visit: Payer: BLUE CROSS/BLUE SHIELD | Admitting: Nutrition

## 2016-03-16 ENCOUNTER — Other Ambulatory Visit (HOSPITAL_BASED_OUTPATIENT_CLINIC_OR_DEPARTMENT_OTHER): Payer: BLUE CROSS/BLUE SHIELD

## 2016-03-16 ENCOUNTER — Encounter: Payer: Self-pay | Admitting: *Deleted

## 2016-03-16 ENCOUNTER — Ambulatory Visit (HOSPITAL_BASED_OUTPATIENT_CLINIC_OR_DEPARTMENT_OTHER): Payer: BLUE CROSS/BLUE SHIELD

## 2016-03-16 VITALS — BP 110/77 | HR 52 | Temp 97.8°F | Resp 16

## 2016-03-16 DIAGNOSIS — C773 Secondary and unspecified malignant neoplasm of axilla and upper limb lymph nodes: Secondary | ICD-10-CM | POA: Diagnosis not present

## 2016-03-16 DIAGNOSIS — Z5111 Encounter for antineoplastic chemotherapy: Secondary | ICD-10-CM

## 2016-03-16 DIAGNOSIS — C50412 Malignant neoplasm of upper-outer quadrant of left female breast: Secondary | ICD-10-CM

## 2016-03-16 DIAGNOSIS — C7951 Secondary malignant neoplasm of bone: Secondary | ICD-10-CM | POA: Diagnosis not present

## 2016-03-16 DIAGNOSIS — Z171 Estrogen receptor negative status [ER-]: Principal | ICD-10-CM

## 2016-03-16 LAB — CBC WITH DIFFERENTIAL/PLATELET
BASO%: 3.4 % — ABNORMAL HIGH (ref 0.0–2.0)
Basophils Absolute: 0.2 10*3/uL — ABNORMAL HIGH (ref 0.0–0.1)
EOS%: 2.8 % (ref 0.0–7.0)
Eosinophils Absolute: 0.1 10*3/uL (ref 0.0–0.5)
HCT: 37.7 % (ref 34.8–46.6)
HGB: 12.7 g/dL (ref 11.6–15.9)
LYMPH%: 24.2 % (ref 14.0–49.7)
MCH: 34.1 pg — AB (ref 25.1–34.0)
MCHC: 33.8 g/dL (ref 31.5–36.0)
MCV: 101 fL (ref 79.5–101.0)
MONO#: 0.5 10*3/uL (ref 0.1–0.9)
MONO%: 9.7 % (ref 0.0–14.0)
NEUT#: 2.9 10*3/uL (ref 1.5–6.5)
NEUT%: 59.9 % (ref 38.4–76.8)
PLATELETS: 331 10*3/uL (ref 145–400)
RBC: 3.73 10*6/uL (ref 3.70–5.45)
RDW: 15.3 % — ABNORMAL HIGH (ref 11.2–14.5)
WBC: 4.8 10*3/uL (ref 3.9–10.3)
lymph#: 1.2 10*3/uL (ref 0.9–3.3)

## 2016-03-16 LAB — COMPREHENSIVE METABOLIC PANEL
ALT: 14 U/L (ref 0–55)
ANION GAP: 7 meq/L (ref 3–11)
AST: 17 U/L (ref 5–34)
Albumin: 3.9 g/dL (ref 3.5–5.0)
Alkaline Phosphatase: 104 U/L (ref 40–150)
BILIRUBIN TOTAL: 0.33 mg/dL (ref 0.20–1.20)
BUN: 11.1 mg/dL (ref 7.0–26.0)
CHLORIDE: 103 meq/L (ref 98–109)
CO2: 27 meq/L (ref 22–29)
Calcium: 9.8 mg/dL (ref 8.4–10.4)
Creatinine: 0.6 mg/dL (ref 0.6–1.1)
Glucose: 89 mg/dl (ref 70–140)
Potassium: 4 mEq/L (ref 3.5–5.1)
Sodium: 137 mEq/L (ref 136–145)
TOTAL PROTEIN: 6.5 g/dL (ref 6.4–8.3)

## 2016-03-16 MED ORDER — SODIUM CHLORIDE 0.9% FLUSH
10.0000 mL | INTRAVENOUS | Status: DC | PRN
  Administered 2016-03-16: 10 mL
  Filled 2016-03-16: qty 10

## 2016-03-16 MED ORDER — DEXAMETHASONE SODIUM PHOSPHATE 10 MG/ML IJ SOLN
INTRAMUSCULAR | Status: AC
  Filled 2016-03-16: qty 1

## 2016-03-16 MED ORDER — DIPHENHYDRAMINE HCL 50 MG/ML IJ SOLN
25.0000 mg | Freq: Once | INTRAMUSCULAR | Status: AC
  Administered 2016-03-16: 25 mg via INTRAVENOUS

## 2016-03-16 MED ORDER — DIPHENHYDRAMINE HCL 50 MG/ML IJ SOLN
INTRAMUSCULAR | Status: AC
  Filled 2016-03-16: qty 1

## 2016-03-16 MED ORDER — PACLITAXEL CHEMO INJECTION 300 MG/50ML
60.0000 mg/m2 | Freq: Once | INTRAVENOUS | Status: AC
  Administered 2016-03-16: 90 mg via INTRAVENOUS
  Filled 2016-03-16: qty 15

## 2016-03-16 MED ORDER — FAMOTIDINE IN NACL 20-0.9 MG/50ML-% IV SOLN
INTRAVENOUS | Status: AC
  Filled 2016-03-16: qty 50

## 2016-03-16 MED ORDER — PALONOSETRON HCL INJECTION 0.25 MG/5ML
INTRAVENOUS | Status: AC
  Filled 2016-03-16: qty 5

## 2016-03-16 MED ORDER — PALONOSETRON HCL INJECTION 0.25 MG/5ML
0.2500 mg | Freq: Once | INTRAVENOUS | Status: AC
Start: 2016-03-16 — End: 2016-03-16
  Administered 2016-03-16: 0.25 mg via INTRAVENOUS

## 2016-03-16 MED ORDER — DEXAMETHASONE SODIUM PHOSPHATE 10 MG/ML IJ SOLN
10.0000 mg | Freq: Once | INTRAMUSCULAR | Status: AC
  Administered 2016-03-16: 10 mg via INTRAVENOUS

## 2016-03-16 MED ORDER — SODIUM CHLORIDE 0.9 % IV SOLN
Freq: Once | INTRAVENOUS | Status: AC
  Administered 2016-03-16: 11:00:00 via INTRAVENOUS

## 2016-03-16 MED ORDER — HEPARIN SOD (PORK) LOCK FLUSH 100 UNIT/ML IV SOLN
500.0000 [IU] | Freq: Once | INTRAVENOUS | Status: AC | PRN
  Administered 2016-03-16: 500 [IU]
  Filled 2016-03-16: qty 5

## 2016-03-16 MED ORDER — FAMOTIDINE IN NACL 20-0.9 MG/50ML-% IV SOLN
20.0000 mg | Freq: Once | INTRAVENOUS | Status: AC
  Administered 2016-03-16: 20 mg via INTRAVENOUS

## 2016-03-16 NOTE — Patient Instructions (Signed)
[facility] Discharge Instructions for Patients Receiving Chemotherapy  Today you received the following chemotherapy agents taxol  To help prevent nausea and vomiting after your treatment, we encourage you to take your nausea medication as directed   If you develop nausea and vomiting that is not controlled by your nausea medication, call the clinic.   BELOW ARE SYMPTOMS THAT SHOULD BE REPORTED IMMEDIATELY:  *FEVER GREATER THAN 100.5 F  *CHILLS WITH OR WITHOUT FEVER  NAUSEA AND VOMITING THAT IS NOT CONTROLLED WITH YOUR NAUSEA MEDICATION  *UNUSUAL SHORTNESS OF BREATH  *UNUSUAL BRUISING OR BLEEDING  TENDERNESS IN MOUTH AND THROAT WITH OR WITHOUT PRESENCE OF ULCERS  *URINARY PROBLEMS  *BOWEL PROBLEMS  UNUSUAL RASH Items with * indicate a potential emergency and should be followed up as soon as possible.  Feel free to call the clinic you have any questions or concerns. The clinic phone number is [phone].  

## 2016-03-16 NOTE — Progress Notes (Signed)
Nutrition follow-up completed with patient receiving treatment for breast cancer. Weight is stable and documented as 98.4 pounds January 19 down slightly from 98.5 pounds January 4. Patient reports her appetite is better and she feels like she is eating more. She tries to drink one Carnation breakfast essentials daily when she "has time".  Denies nausea, vomiting, diarrhea, constipation.  Nutrition diagnosis: Underweight continues.  Intervention: I educated patient to purchase premixed bottles of Carnation breakfast essentials and increase intake to 1-2 bottles daily. Encouraged patient to continue high-calorie high-protein foods. Teach back method used.  Monitoring, evaluation, goals: Patient will tolerate increased calories and protein to minimize further weight loss.  Next visit: Thursday, February 15, during infusion.  **Disclaimer: This note was dictated with voice recognition software. Similar sounding words can inadvertently be transcribed and this note may contain transcription errors which may not have been corrected upon publication of note.**

## 2016-03-17 ENCOUNTER — Other Ambulatory Visit: Payer: BLUE CROSS/BLUE SHIELD

## 2016-03-17 ENCOUNTER — Ambulatory Visit: Payer: BLUE CROSS/BLUE SHIELD

## 2016-03-22 ENCOUNTER — Other Ambulatory Visit: Payer: Self-pay | Admitting: Emergency Medicine

## 2016-03-22 DIAGNOSIS — Z171 Estrogen receptor negative status [ER-]: Principal | ICD-10-CM

## 2016-03-22 DIAGNOSIS — C50412 Malignant neoplasm of upper-outer quadrant of left female breast: Secondary | ICD-10-CM

## 2016-03-22 NOTE — Assessment & Plan Note (Signed)
Left mastectomy 10/28/2015: IDC grade 3, 3 cm, lymphovascular invasion present, margins negative, 0/6 lymph nodes, ER 0%, PR 0%, HER-2 negative, Ki-67 60%, T2 N0 stage II a  Treatment plan: Adjuvant chemotherapy with dose dense Adriamycin and Cytoxan every 2 weeks 4 followed by weekly Taxol 12 No role of adjuvant radiation if the lymph nodes and margins are negative. No role for antiestrogen therapy since she is triple negative. ------------------------------------------------------------------------------------------------------------------------------------------------------------ Current treatment: Today's cycle 10Taxol. Echocardiogram 10/15/2015: EF 60-65% Labs have been reviewed thoroughly. Monitoring closely for chemotherapy toxicities  Chemotherapy toxicities: 1. Grade 3 neutropenia: We reducedthe dosage of cycle 2 of dose dense Adriamycin and Cytoxan 2. Nausea grade 2: She is tolerating Zofran better and has not required to much of it. 3. Fatigue grade 2: From day 3 today 5 profound fatigue. Gets better afterwards 4. Alopecia 5. Loss of taste and appetite: Patient needing 6-7 small meals every day 6. Weight loss: Patient is working with our dietitian  I decreased the dosage of Taxol with cycle 4. I added Aloxi to her treatment.  Depression: OnEffexor.  Return to clinic in 2weeksfor Cycle 12Taxol This concludes her treatment. RTC in 3 months for follow up.

## 2016-03-23 ENCOUNTER — Encounter: Payer: Self-pay | Admitting: *Deleted

## 2016-03-23 ENCOUNTER — Ambulatory Visit (HOSPITAL_BASED_OUTPATIENT_CLINIC_OR_DEPARTMENT_OTHER): Payer: BLUE CROSS/BLUE SHIELD | Admitting: Hematology and Oncology

## 2016-03-23 ENCOUNTER — Encounter: Payer: Self-pay | Admitting: Hematology and Oncology

## 2016-03-23 ENCOUNTER — Other Ambulatory Visit (HOSPITAL_BASED_OUTPATIENT_CLINIC_OR_DEPARTMENT_OTHER): Payer: BLUE CROSS/BLUE SHIELD

## 2016-03-23 ENCOUNTER — Ambulatory Visit (HOSPITAL_BASED_OUTPATIENT_CLINIC_OR_DEPARTMENT_OTHER): Payer: BLUE CROSS/BLUE SHIELD

## 2016-03-23 DIAGNOSIS — Z171 Estrogen receptor negative status [ER-]: Principal | ICD-10-CM

## 2016-03-23 DIAGNOSIS — F329 Major depressive disorder, single episode, unspecified: Secondary | ICD-10-CM | POA: Diagnosis not present

## 2016-03-23 DIAGNOSIS — C50412 Malignant neoplasm of upper-outer quadrant of left female breast: Secondary | ICD-10-CM

## 2016-03-23 DIAGNOSIS — Z5111 Encounter for antineoplastic chemotherapy: Secondary | ICD-10-CM

## 2016-03-23 DIAGNOSIS — R530 Neoplastic (malignant) related fatigue: Secondary | ICD-10-CM

## 2016-03-23 DIAGNOSIS — R634 Abnormal weight loss: Secondary | ICD-10-CM | POA: Diagnosis not present

## 2016-03-23 LAB — CBC WITH DIFFERENTIAL/PLATELET
BASO%: 1.8 % (ref 0.0–2.0)
BASOS ABS: 0.1 10*3/uL (ref 0.0–0.1)
EOS%: 1.2 % (ref 0.0–7.0)
Eosinophils Absolute: 0.1 10*3/uL (ref 0.0–0.5)
HEMATOCRIT: 39.4 % (ref 34.8–46.6)
HEMOGLOBIN: 13.2 g/dL (ref 11.6–15.9)
LYMPH#: 0.9 10*3/uL (ref 0.9–3.3)
LYMPH%: 15.9 % (ref 14.0–49.7)
MCH: 33.7 pg (ref 25.1–34.0)
MCHC: 33.5 g/dL (ref 31.5–36.0)
MCV: 100.8 fL (ref 79.5–101.0)
MONO#: 0.6 10*3/uL (ref 0.1–0.9)
MONO%: 10.6 % (ref 0.0–14.0)
NEUT#: 3.9 10*3/uL (ref 1.5–6.5)
NEUT%: 70.5 % (ref 38.4–76.8)
Platelets: 345 10*3/uL (ref 145–400)
RBC: 3.91 10*6/uL (ref 3.70–5.45)
RDW: 15.1 % — AB (ref 11.2–14.5)
WBC: 5.6 10*3/uL (ref 3.9–10.3)

## 2016-03-23 LAB — COMPREHENSIVE METABOLIC PANEL
ALBUMIN: 4.3 g/dL (ref 3.5–5.0)
ALK PHOS: 100 U/L (ref 40–150)
ALT: 15 U/L (ref 0–55)
AST: 17 U/L (ref 5–34)
Anion Gap: 8 mEq/L (ref 3–11)
BUN: 7.9 mg/dL (ref 7.0–26.0)
CHLORIDE: 102 meq/L (ref 98–109)
CO2: 27 meq/L (ref 22–29)
Calcium: 9.7 mg/dL (ref 8.4–10.4)
Creatinine: 0.7 mg/dL (ref 0.6–1.1)
EGFR: >90 mL/min/{1.73_m2} (ref >90)
GLUCOSE: 99 mg/dL (ref 70–140)
POTASSIUM: 4.4 meq/L (ref 3.5–5.1)
SODIUM: 137 meq/L (ref 136–145)
Total Bilirubin: 0.31 mg/dL (ref 0.20–1.20)
Total Protein: 6.5 g/dL (ref 6.4–8.3)

## 2016-03-23 MED ORDER — SODIUM CHLORIDE 0.9 % IV SOLN
Freq: Once | INTRAVENOUS | Status: AC
  Administered 2016-03-23: 12:00:00 via INTRAVENOUS

## 2016-03-23 MED ORDER — DIPHENHYDRAMINE HCL 50 MG/ML IJ SOLN
INTRAMUSCULAR | Status: AC
  Filled 2016-03-23: qty 1

## 2016-03-23 MED ORDER — PALONOSETRON HCL INJECTION 0.25 MG/5ML
0.2500 mg | Freq: Once | INTRAVENOUS | Status: AC
Start: 2016-03-23 — End: 2016-03-23
  Administered 2016-03-23: 0.25 mg via INTRAVENOUS

## 2016-03-23 MED ORDER — FAMOTIDINE IN NACL 20-0.9 MG/50ML-% IV SOLN
20.0000 mg | Freq: Once | INTRAVENOUS | Status: AC
Start: 2016-03-23 — End: 2016-03-23
  Administered 2016-03-23: 20 mg via INTRAVENOUS

## 2016-03-23 MED ORDER — DEXTROSE 5 % IV SOLN
60.0000 mg/m2 | Freq: Once | INTRAVENOUS | Status: AC
  Administered 2016-03-23: 90 mg via INTRAVENOUS
  Filled 2016-03-23: qty 15

## 2016-03-23 MED ORDER — FAMOTIDINE IN NACL 20-0.9 MG/50ML-% IV SOLN
INTRAVENOUS | Status: AC
  Filled 2016-03-23: qty 50

## 2016-03-23 MED ORDER — DEXAMETHASONE SODIUM PHOSPHATE 10 MG/ML IJ SOLN
INTRAMUSCULAR | Status: AC
  Filled 2016-03-23: qty 1

## 2016-03-23 MED ORDER — DIPHENHYDRAMINE HCL 50 MG/ML IJ SOLN
25.0000 mg | Freq: Once | INTRAMUSCULAR | Status: AC
  Administered 2016-03-23: 25 mg via INTRAVENOUS

## 2016-03-23 MED ORDER — SODIUM CHLORIDE 0.9% FLUSH
10.0000 mL | INTRAVENOUS | Status: DC | PRN
  Administered 2016-03-23: 10 mL
  Filled 2016-03-23: qty 10

## 2016-03-23 MED ORDER — PALONOSETRON HCL INJECTION 0.25 MG/5ML
INTRAVENOUS | Status: AC
  Filled 2016-03-23: qty 5

## 2016-03-23 MED ORDER — HEPARIN SOD (PORK) LOCK FLUSH 100 UNIT/ML IV SOLN
500.0000 [IU] | Freq: Once | INTRAVENOUS | Status: AC | PRN
  Administered 2016-03-23: 500 [IU]
  Filled 2016-03-23: qty 5

## 2016-03-23 MED ORDER — DEXAMETHASONE SODIUM PHOSPHATE 10 MG/ML IJ SOLN
10.0000 mg | Freq: Once | INTRAMUSCULAR | Status: AC
  Administered 2016-03-23: 10 mg via INTRAVENOUS

## 2016-03-23 NOTE — Progress Notes (Signed)
Patient Care Team: Berkley Harvey, NP as PCP - General (Nurse Practitioner) Erroll Luna, MD as Consulting Physician (General Surgery) Nicholas Lose, MD as Consulting Physician (Hematology and Oncology) Kyung Rudd, MD as Consulting Physician (Radiation Oncology)  DIAGNOSIS:  Encounter Diagnosis  Name Primary?  . Malignant neoplasm of upper-outer quadrant of left breast in female, estrogen receptor negative ([place])     SUMMARY OF ONCOLOGIC HISTORY:   Breast cancer of upper-outer quadrant of left female breast ([place])   09/28/2000 Initial Biopsy    Left lumpectomy for stage I breast cancer that was ER/PR negative followed by adjuvant radiation      09/20/2015 Initial Diagnosis    Left breast mass 3 cm 1:00 position: IDC grade 2, ER/PR negative, Ki-67 60%, HER-2 negative, T2 N0 stage IIA clinical stage      10/28/2015 Surgery    Left mastectomy: IDC grade 3, 3 cm, lymphovascular invasion present, margins negative, 0/6 lymph nodes, ER 0%, PR 0%, HER-2 negative, Ki-67 60%, T2 N0 stage II a      11/25/2015 -  Chemotherapy    Adjuvant chemotherapy with Dose dense Adriamycin and Cytoxan 4 followed by Taxol 12        CHIEF COMPLIANT: Cycle 10 Taxol  INTERVAL HISTORY: Leah Mcdowell is a 61-year-old with above-mentioned history of left breast cancer treated with mastectomy and is currently on adjuvant chemotherapy and today is cycle 10 of Taxol. Recently she's been tolerating chemotherapy extremely well. She does have moderate fatigue but much improved than before. Denies any nausea vomiting. Denies any significant worsening of neuropathy. She has numbness of the tips of the fingers and toes. She continues to lose weight and has poor appetite in general. She has good taste though.  REVIEW OF SYSTEMS:   Constitutional: Denies fevers, chills or abnormal weight loss Eyes: Denies blurriness of vision Ears, nose, mouth, throat, and face: Denies mucositis or sore throat Respiratory: Denies cough,  dyspnea or wheezes Cardiovascular: Denies palpitation, chest discomfort Gastrointestinal:  Denies nausea, heartburn or change in bowel habits Skin: Denies abnormal skin rashes Lymphatics: Denies new lymphadenopathy or easy bruising Neurological: Numbness of the tips of the fingers and toes Behavioral/Psych: Mood is stable, no new changes  Extremities: No lower extremity edema All other systems were reviewed with the patient and are negative.  I have reviewed the past medical history, past surgical history, social history and family history with the patient and they are unchanged from previous note.  ALLERGIES:  is allergic to no known allergies.  MEDICATIONS:  Current Outpatient Prescriptions  Medication Sig Dispense Refill  . LORazepam (ATIVAN) 1 MG tablet Take 1 tablet (1 mg total) by mouth every 8 (eight) hours as needed for anxiety (or nausea). 30 tablet 0  . venlafaxine XR (EFFEXOR-XR) 37.5 MG 24 hr capsule Take 1 capsule (37.5 mg total) by mouth daily with breakfast. 30 capsule 3   No current facility-administered medications for this visit.     PHYSICAL EXAMINATION: ECOG PERFORMANCE STATUS: 2 - Symptomatic, <50% confined to bed  Vitals:   03/23/16 1055  BP: 134/72  Pulse: 94  Resp: 18  Temp: 97.8 F (36.6 C)   Filed Weights   03/23/16 1055  Weight: 98 lb 1.6 oz (44.5 kg)    GENERAL:alert, no distress and comfortable SKIN: skin color, texture, turgor are normal, no rashes or significant lesions EYES: normal, Conjunctiva are pink and non-injected, sclera clear OROPHARYNX:no exudate, no erythema and lips, buccal mucosa, and tongue normal  NECK:  supple, thyroid normal size, non-tender, without nodularity LYMPH:  no palpable lymphadenopathy in the cervical, axillary or inguinal LUNGS: clear to auscultation and percussion with normal breathing effort HEART: regular rate & rhythm and no murmurs and no lower extremity edema ABDOMEN:abdomen soft, non-tender and normal  bowel sounds MUSCULOSKELETAL:no cyanosis of digits and no clubbing  NEURO: alert & oriented x 3 with fluent speech, no focal motor/sensory deficits EXTREMITIES: No lower extremity edema  LABORATORY DATA:  I have reviewed the data as listed   Chemistry      Component Value Date/Time   NA 137 03/16/2016 1018   K 4.0 03/16/2016 1018   CL 105 10/29/2015 0351   CO2 27 03/16/2016 1018   BUN 11.1 03/16/2016 1018   CREATININE 0.6 03/16/2016 1018      Component Value Date/Time   CALCIUM 9.8 03/16/2016 1018   ALKPHOS 104 03/16/2016 1018   AST 17 03/16/2016 1018   ALT 14 03/16/2016 1018   BILITOT 0.33 03/16/2016 1018       Lab Results  Component Value Date   WBC 5.6 03/23/2016   HGB 13.2 03/23/2016   HCT 39.4 03/23/2016   MCV 100.8 03/23/2016   PLT 345 03/23/2016   NEUTROABS 3.9 03/23/2016    ASSESSMENT & PLAN:  Breast cancer of upper-outer quadrant of left female breast ([place]) Left mastectomy 10/28/2015: IDC grade 3, 3 cm, lymphovascular invasion present, margins negative, 0/6 lymph nodes, ER 0%, PR 0%, HER-2 negative, Ki-67 60%, T2 N0 stage II a  Treatment plan: Adjuvant chemotherapy with dose dense Adriamycin and Cytoxan every 2 weeks 4 followed by weekly Taxol 12 No role of adjuvant radiation if the lymph nodes and margins are negative. No role for antiestrogen therapy since she is triple negative. ------------------------------------------------------------------------------------------------------------------------------------------------------------ Current treatment: Today's cycle 10Taxol. Echocardiogram 10/15/2015: EF 60-65% Labs have been reviewed thoroughly. Monitoring closely for chemotherapy toxicities  Chemotherapy toxicities: 1. Grade 3 neutropenia: We reducedthe dosage of cycle 2 of dose dense Adriamycin and Cytoxan 2. Nausea grade 2: She is tolerating Zofran better and has not required to much of it. 3. Fatigue grade 2: From day 3 today 5 profound  fatigue. Gets better afterwards 4. Alopecia 5. Loss of taste and appetite: Patient needing 6-7 small meals every day 6. Weight loss: Patient is working with our dietitian  I decreased the dosage of Taxol with cycle 4. I added Aloxi to her treatment.  Depression: OnEffexor.  Return to clinic in 2weeksfor Cycle 12Taxol This concludes her treatment.  I spent 25 minutes talking to the patient of which more than half was spent in counseling and coordination of care.  No orders of the defined types were placed in this encounter.  The patient has a good understanding of the overall plan. she agrees with it. she will call with any problems that may develop before the next visit here.   Rulon Eisenmenger, MD 03/23/16

## 2016-03-23 NOTE — Patient Instructions (Signed)
[facility] Discharge Instructions for Patients Receiving Chemotherapy  Today you received the following chemotherapy agents Taxol.  To help prevent nausea and vomiting after your treatment, we encourage you to take your nausea medication as directed.    If you develop nausea and vomiting that is not controlled by your nausea medication, call the clinic.   BELOW ARE SYMPTOMS THAT SHOULD BE REPORTED IMMEDIATELY:  *FEVER GREATER THAN 100.5 F  *CHILLS WITH OR WITHOUT FEVER  NAUSEA AND VOMITING THAT IS NOT CONTROLLED WITH YOUR NAUSEA MEDICATION  *UNUSUAL SHORTNESS OF BREATH  *UNUSUAL BRUISING OR BLEEDING  TENDERNESS IN MOUTH AND THROAT WITH OR WITHOUT PRESENCE OF ULCERS  *URINARY PROBLEMS  *BOWEL PROBLEMS  UNUSUAL RASH Items with * indicate a potential emergency and should be followed up as soon as possible.  Feel free to call the clinic you have any questions or concerns. The clinic phone number is [phone].    

## 2016-03-24 ENCOUNTER — Ambulatory Visit: Payer: BLUE CROSS/BLUE SHIELD

## 2016-03-24 ENCOUNTER — Other Ambulatory Visit: Payer: BLUE CROSS/BLUE SHIELD

## 2016-03-24 ENCOUNTER — Ambulatory Visit: Payer: BLUE CROSS/BLUE SHIELD | Admitting: Hematology and Oncology

## 2016-03-30 ENCOUNTER — Other Ambulatory Visit (HOSPITAL_BASED_OUTPATIENT_CLINIC_OR_DEPARTMENT_OTHER): Payer: BLUE CROSS/BLUE SHIELD

## 2016-03-30 ENCOUNTER — Other Ambulatory Visit: Payer: Self-pay | Admitting: Emergency Medicine

## 2016-03-30 ENCOUNTER — Encounter: Payer: Self-pay | Admitting: *Deleted

## 2016-03-30 ENCOUNTER — Ambulatory Visit (HOSPITAL_BASED_OUTPATIENT_CLINIC_OR_DEPARTMENT_OTHER): Payer: BLUE CROSS/BLUE SHIELD

## 2016-03-30 VITALS — BP 135/91 | HR 88 | Temp 97.9°F | Resp 18

## 2016-03-30 DIAGNOSIS — Z171 Estrogen receptor negative status [ER-]: Principal | ICD-10-CM

## 2016-03-30 DIAGNOSIS — C50412 Malignant neoplasm of upper-outer quadrant of left female breast: Secondary | ICD-10-CM

## 2016-03-30 DIAGNOSIS — Z5111 Encounter for antineoplastic chemotherapy: Secondary | ICD-10-CM

## 2016-03-30 LAB — CBC WITH DIFFERENTIAL/PLATELET
BASO%: 2.6 % — AB (ref 0.0–2.0)
Basophils Absolute: 0.1 10*3/uL (ref 0.0–0.1)
EOS%: 2.7 % (ref 0.0–7.0)
Eosinophils Absolute: 0.1 10*3/uL (ref 0.0–0.5)
HEMATOCRIT: 40 % (ref 34.8–46.6)
HEMOGLOBIN: 13.4 g/dL (ref 11.6–15.9)
LYMPH#: 1.3 10*3/uL (ref 0.9–3.3)
LYMPH%: 25.7 % (ref 14.0–49.7)
MCH: 33.8 pg (ref 25.1–34.0)
MCHC: 33.4 g/dL (ref 31.5–36.0)
MCV: 101.3 fL — ABNORMAL HIGH (ref 79.5–101.0)
MONO#: 0.6 10*3/uL (ref 0.1–0.9)
MONO%: 11.4 % (ref 0.0–14.0)
NEUT%: 57.6 % (ref 38.4–76.8)
NEUTROS ABS: 2.9 10*3/uL (ref 1.5–6.5)
PLATELETS: 302 10*3/uL (ref 145–400)
RBC: 3.95 10*6/uL (ref 3.70–5.45)
RDW: 14.9 % — ABNORMAL HIGH (ref 11.2–14.5)
WBC: 5 10*3/uL (ref 3.9–10.3)

## 2016-03-30 LAB — COMPREHENSIVE METABOLIC PANEL
ALT: 16 U/L (ref 0–55)
ANION GAP: 8 meq/L (ref 3–11)
AST: 16 U/L (ref 5–34)
Albumin: 4 g/dL (ref 3.5–5.0)
Alkaline Phosphatase: 96 U/L (ref 40–150)
BILIRUBIN TOTAL: 0.35 mg/dL (ref 0.20–1.20)
BUN: 12.3 mg/dL (ref 7.0–26.0)
CALCIUM: 9.5 mg/dL (ref 8.4–10.4)
CO2: 26 mEq/L (ref 22–29)
CREATININE: 0.6 mg/dL (ref 0.6–1.1)
Chloride: 105 mEq/L (ref 98–109)
EGFR: >90 mL/min/{1.73_m2} (ref >90)
Glucose: 98 mg/dl (ref 70–140)
Potassium: 3.8 mEq/L (ref 3.5–5.1)
Sodium: 139 mEq/L (ref 136–145)
TOTAL PROTEIN: 6.4 g/dL (ref 6.4–8.3)

## 2016-03-30 MED ORDER — DEXAMETHASONE SODIUM PHOSPHATE 10 MG/ML IJ SOLN
INTRAMUSCULAR | Status: AC
  Filled 2016-03-30: qty 1

## 2016-03-30 MED ORDER — SODIUM CHLORIDE 0.9 % IV SOLN
Freq: Once | INTRAVENOUS | Status: AC
  Administered 2016-03-30: 11:00:00 via INTRAVENOUS

## 2016-03-30 MED ORDER — DIPHENHYDRAMINE HCL 50 MG/ML IJ SOLN
INTRAMUSCULAR | Status: AC
  Filled 2016-03-30: qty 1

## 2016-03-30 MED ORDER — DIPHENHYDRAMINE HCL 50 MG/ML IJ SOLN
25.0000 mg | Freq: Once | INTRAMUSCULAR | Status: AC
  Administered 2016-03-30: 25 mg via INTRAVENOUS

## 2016-03-30 MED ORDER — FAMOTIDINE IN NACL 20-0.9 MG/50ML-% IV SOLN
20.0000 mg | Freq: Once | INTRAVENOUS | Status: AC
  Administered 2016-03-30: 20 mg via INTRAVENOUS

## 2016-03-30 MED ORDER — FAMOTIDINE IN NACL 20-0.9 MG/50ML-% IV SOLN
INTRAVENOUS | Status: AC
  Filled 2016-03-30: qty 50

## 2016-03-30 MED ORDER — PALONOSETRON HCL INJECTION 0.25 MG/5ML
INTRAVENOUS | Status: AC
  Filled 2016-03-30: qty 5

## 2016-03-30 MED ORDER — PACLITAXEL CHEMO INJECTION 300 MG/50ML
60.0000 mg/m2 | Freq: Once | INTRAVENOUS | Status: AC
  Administered 2016-03-30: 90 mg via INTRAVENOUS
  Filled 2016-03-30: qty 15

## 2016-03-30 MED ORDER — SODIUM CHLORIDE 0.9% FLUSH
10.0000 mL | INTRAVENOUS | Status: DC | PRN
  Administered 2016-03-30: 10 mL
  Filled 2016-03-30: qty 10

## 2016-03-30 MED ORDER — DEXAMETHASONE SODIUM PHOSPHATE 10 MG/ML IJ SOLN
10.0000 mg | Freq: Once | INTRAMUSCULAR | Status: AC
  Administered 2016-03-30: 10 mg via INTRAVENOUS

## 2016-03-30 MED ORDER — PALONOSETRON HCL INJECTION 0.25 MG/5ML
0.2500 mg | Freq: Once | INTRAVENOUS | Status: AC
  Administered 2016-03-30: 0.25 mg via INTRAVENOUS

## 2016-03-30 MED ORDER — HEPARIN SOD (PORK) LOCK FLUSH 100 UNIT/ML IV SOLN
500.0000 [IU] | Freq: Once | INTRAVENOUS | Status: AC | PRN
  Administered 2016-03-30: 500 [IU]
  Filled 2016-03-30: qty 5

## 2016-03-30 NOTE — Patient Instructions (Signed)
[facility] Discharge Instructions for Patients Receiving Chemotherapy  Today you received the following chemotherapy agents Taxol.  To help prevent nausea and vomiting after your treatment, we encourage you to take your nausea medication as directed.    If you develop nausea and vomiting that is not controlled by your nausea medication, call the clinic.   BELOW ARE SYMPTOMS THAT SHOULD BE REPORTED IMMEDIATELY:  *FEVER GREATER THAN 100.5 F  *CHILLS WITH OR WITHOUT FEVER  NAUSEA AND VOMITING THAT IS NOT CONTROLLED WITH YOUR NAUSEA MEDICATION  *UNUSUAL SHORTNESS OF BREATH  *UNUSUAL BRUISING OR BLEEDING  TENDERNESS IN MOUTH AND THROAT WITH OR WITHOUT PRESENCE OF ULCERS  *URINARY PROBLEMS  *BOWEL PROBLEMS  UNUSUAL RASH Items with * indicate a potential emergency and should be followed up as soon as possible.  Feel free to call the clinic you have any questions or concerns. The clinic phone number is [phone].    

## 2016-03-31 ENCOUNTER — Other Ambulatory Visit: Payer: BLUE CROSS/BLUE SHIELD

## 2016-03-31 ENCOUNTER — Ambulatory Visit: Payer: BLUE CROSS/BLUE SHIELD

## 2016-04-05 ENCOUNTER — Other Ambulatory Visit: Payer: Self-pay

## 2016-04-05 ENCOUNTER — Other Ambulatory Visit: Payer: Self-pay | Admitting: Hematology and Oncology

## 2016-04-05 DIAGNOSIS — C50412 Malignant neoplasm of upper-outer quadrant of left female breast: Secondary | ICD-10-CM

## 2016-04-05 DIAGNOSIS — Z171 Estrogen receptor negative status [ER-]: Principal | ICD-10-CM

## 2016-04-05 NOTE — Assessment & Plan Note (Signed)
Left mastectomy 10/28/2015: IDC grade 3, 3 cm, lymphovascular invasion present, margins negative, 0/6 lymph nodes, ER 0%, PR 0%, HER-2 negative, Ki-67 60%, T2 N0 stage II a  Treatment plan: Adjuvant chemotherapy with dose dense Adriamycin and Cytoxan every 2 weeks 4 followed by weekly Taxol 12 No role of adjuvant radiation if the lymph nodes and margins are negative. No role for antiestrogen therapy since she is triple negative. ------------------------------------------------------------------------------------------------------------------------------------------------------------ Current treatment: Today's cycle 12Taxol. Echocardiogram 10/15/2015: EF 60-65% Labs have been reviewed thoroughly. Monitoring closely for chemotherapy toxicities  Chemotherapy toxicities: 1. Grade 3 neutropenia: We reducedthe dosage of cycle 2 of dose dense Adriamycin and Cytoxan 2. Nausea grade 2: She is tolerating Zofran better and has not required to much of it. 3. Fatigue grade 2: From day 3 today 5 profound fatigue. Gets better afterwards 4. Alopecia 5. Loss of taste and appetite: Patient needing 6-7 small meals every day 6. Weight loss: Patient is working with our dietitian  I decreased the dosage of Taxol with cycle 4. I added Aloxi to her treatment.  Depression: OnEffexor.  This concludes her treatment. RTC in 3 months for follow up.

## 2016-04-06 ENCOUNTER — Other Ambulatory Visit (HOSPITAL_BASED_OUTPATIENT_CLINIC_OR_DEPARTMENT_OTHER): Payer: BLUE CROSS/BLUE SHIELD

## 2016-04-06 ENCOUNTER — Encounter: Payer: Self-pay | Admitting: Hematology and Oncology

## 2016-04-06 ENCOUNTER — Encounter: Payer: Self-pay | Admitting: *Deleted

## 2016-04-06 ENCOUNTER — Ambulatory Visit (HOSPITAL_BASED_OUTPATIENT_CLINIC_OR_DEPARTMENT_OTHER): Payer: BLUE CROSS/BLUE SHIELD | Admitting: Hematology and Oncology

## 2016-04-06 ENCOUNTER — Ambulatory Visit: Payer: BLUE CROSS/BLUE SHIELD | Admitting: Nutrition

## 2016-04-06 ENCOUNTER — Ambulatory Visit (HOSPITAL_BASED_OUTPATIENT_CLINIC_OR_DEPARTMENT_OTHER): Payer: BLUE CROSS/BLUE SHIELD

## 2016-04-06 DIAGNOSIS — Z171 Estrogen receptor negative status [ER-]: Principal | ICD-10-CM

## 2016-04-06 DIAGNOSIS — R630 Anorexia: Secondary | ICD-10-CM

## 2016-04-06 DIAGNOSIS — F329 Major depressive disorder, single episode, unspecified: Secondary | ICD-10-CM

## 2016-04-06 DIAGNOSIS — Z5111 Encounter for antineoplastic chemotherapy: Secondary | ICD-10-CM

## 2016-04-06 DIAGNOSIS — C50412 Malignant neoplasm of upper-outer quadrant of left female breast: Secondary | ICD-10-CM

## 2016-04-06 DIAGNOSIS — L658 Other specified nonscarring hair loss: Secondary | ICD-10-CM

## 2016-04-06 DIAGNOSIS — R530 Neoplastic (malignant) related fatigue: Secondary | ICD-10-CM | POA: Diagnosis not present

## 2016-04-06 DIAGNOSIS — R439 Unspecified disturbances of smell and taste: Secondary | ICD-10-CM

## 2016-04-06 DIAGNOSIS — R110 Nausea: Secondary | ICD-10-CM | POA: Diagnosis not present

## 2016-04-06 DIAGNOSIS — R634 Abnormal weight loss: Secondary | ICD-10-CM

## 2016-04-06 LAB — CBC WITH DIFFERENTIAL/PLATELET
BASO%: 3 % — ABNORMAL HIGH (ref 0.0–2.0)
BASOS ABS: 0.2 10*3/uL — AB (ref 0.0–0.1)
EOS ABS: 0.1 10*3/uL (ref 0.0–0.5)
EOS%: 1.4 % (ref 0.0–7.0)
HCT: 40.7 % (ref 34.8–46.6)
HGB: 13.8 g/dL (ref 11.6–15.9)
LYMPH%: 21.4 % (ref 14.0–49.7)
MCH: 34.2 pg — AB (ref 25.1–34.0)
MCHC: 34 g/dL (ref 31.5–36.0)
MCV: 100.5 fL (ref 79.5–101.0)
MONO#: 0.8 10*3/uL (ref 0.1–0.9)
MONO%: 12 % (ref 0.0–14.0)
NEUT%: 62.2 % (ref 38.4–76.8)
NEUTROS ABS: 4 10*3/uL (ref 1.5–6.5)
Platelets: 330 10*3/uL (ref 145–400)
RBC: 4.05 10*6/uL (ref 3.70–5.45)
RDW: 15.4 % — ABNORMAL HIGH (ref 11.2–14.5)
WBC: 6.4 10*3/uL (ref 3.9–10.3)
lymph#: 1.4 10*3/uL (ref 0.9–3.3)

## 2016-04-06 LAB — COMPREHENSIVE METABOLIC PANEL
ALT: 12 U/L (ref 0–55)
AST: 16 U/L (ref 5–34)
Albumin: 4.2 g/dL (ref 3.5–5.0)
Alkaline Phosphatase: 88 U/L (ref 40–150)
Anion Gap: 9 mEq/L (ref 3–11)
BUN: 8.5 mg/dL (ref 7.0–26.0)
CO2: 26 meq/L (ref 22–29)
Calcium: 9.7 mg/dL (ref 8.4–10.4)
Chloride: 103 mEq/L (ref 98–109)
Creatinine: 0.7 mg/dL (ref 0.6–1.1)
GLUCOSE: 92 mg/dL (ref 70–140)
POTASSIUM: 4.3 meq/L (ref 3.5–5.1)
SODIUM: 137 meq/L (ref 136–145)
Total Bilirubin: 0.33 mg/dL (ref 0.20–1.20)
Total Protein: 6.5 g/dL (ref 6.4–8.3)

## 2016-04-06 MED ORDER — SODIUM CHLORIDE 0.9% FLUSH
10.0000 mL | INTRAVENOUS | Status: DC | PRN
Start: 2016-04-06 — End: 2016-04-06
  Administered 2016-04-06: 10 mL
  Filled 2016-04-06: qty 10

## 2016-04-06 MED ORDER — DEXAMETHASONE SODIUM PHOSPHATE 10 MG/ML IJ SOLN
INTRAMUSCULAR | Status: AC
  Filled 2016-04-06: qty 1

## 2016-04-06 MED ORDER — FAMOTIDINE IN NACL 20-0.9 MG/50ML-% IV SOLN
20.0000 mg | Freq: Once | INTRAVENOUS | Status: AC
  Administered 2016-04-06: 20 mg via INTRAVENOUS

## 2016-04-06 MED ORDER — DIPHENHYDRAMINE HCL 50 MG/ML IJ SOLN
INTRAMUSCULAR | Status: AC
  Filled 2016-04-06: qty 1

## 2016-04-06 MED ORDER — VENLAFAXINE HCL ER 37.5 MG PO CP24: 37.5000 mg | ORAL_CAPSULE | Freq: Every day | ORAL | 1 refills | Status: DC

## 2016-04-06 MED ORDER — FAMOTIDINE IN NACL 20-0.9 MG/50ML-% IV SOLN
INTRAVENOUS | Status: AC
  Filled 2016-04-06: qty 50

## 2016-04-06 MED ORDER — DIPHENHYDRAMINE HCL 50 MG/ML IJ SOLN
25.0000 mg | Freq: Once | INTRAMUSCULAR | Status: AC
  Administered 2016-04-06: 25 mg via INTRAVENOUS

## 2016-04-06 MED ORDER — PALONOSETRON HCL INJECTION 0.25 MG/5ML
INTRAVENOUS | Status: AC
  Filled 2016-04-06: qty 5

## 2016-04-06 MED ORDER — SODIUM CHLORIDE 0.9 % IV SOLN
Freq: Once | INTRAVENOUS | Status: AC
  Administered 2016-04-06: 12:00:00 via INTRAVENOUS

## 2016-04-06 MED ORDER — HEPARIN SOD (PORK) LOCK FLUSH 100 UNIT/ML IV SOLN
500.0000 [IU] | Freq: Once | INTRAVENOUS | Status: AC | PRN
  Administered 2016-04-06: 500 [IU]
  Filled 2016-04-06: qty 5

## 2016-04-06 MED ORDER — PACLITAXEL CHEMO INJECTION 300 MG/50ML
60.0000 mg/m2 | Freq: Once | INTRAVENOUS | Status: AC
  Administered 2016-04-06: 90 mg via INTRAVENOUS
  Filled 2016-04-06: qty 15

## 2016-04-06 MED ORDER — DEXAMETHASONE SODIUM PHOSPHATE 10 MG/ML IJ SOLN
10.0000 mg | Freq: Once | INTRAMUSCULAR | Status: AC
  Administered 2016-04-06: 10 mg via INTRAVENOUS

## 2016-04-06 MED ORDER — PALONOSETRON HCL INJECTION 0.25 MG/5ML
0.2500 mg | Freq: Once | INTRAVENOUS | Status: AC
  Administered 2016-04-06: 0.25 mg via INTRAVENOUS

## 2016-04-06 NOTE — Patient Instructions (Signed)
[facility] Discharge Instructions for Patients Receiving Chemotherapy  Today you received the following chemotherapy agents :  Taxol,  To help prevent nausea and vomiting after your treatment, we encourage you to take your nausea medication as prescribed.   If you develop nausea and vomiting that is not controlled by your nausea medication, call the clinic.   BELOW ARE SYMPTOMS THAT SHOULD BE REPORTED IMMEDIATELY:  *FEVER GREATER THAN 100.5 F  *CHILLS WITH OR WITHOUT FEVER  NAUSEA AND VOMITING THAT IS NOT CONTROLLED WITH YOUR NAUSEA MEDICATION  *UNUSUAL SHORTNESS OF BREATH  *UNUSUAL BRUISING OR BLEEDING  TENDERNESS IN MOUTH AND THROAT WITH OR WITHOUT PRESENCE OF ULCERS  *URINARY PROBLEMS  *BOWEL PROBLEMS  UNUSUAL RASH Items with * indicate a potential emergency and should be followed up as soon as possible.  Feel free to call the clinic you have any questions or concerns. The clinic phone number is [phone].  Please show the [place] at check-in to the Emergency Department and triage nurse.

## 2016-04-06 NOTE — Progress Notes (Signed)
Patient Care Team: Berkley Harvey, NP as PCP - General (Nurse Practitioner) Erroll Luna, MD as Consulting Physician (General Surgery) Nicholas Lose, MD as Consulting Physician (Hematology and Oncology) Kyung Rudd, MD as Consulting Physician (Radiation Oncology)  DIAGNOSIS:  Encounter Diagnosis  Name Primary?  . Malignant neoplasm of upper-outer quadrant of left breast in female, estrogen receptor negative ([place])     SUMMARY OF ONCOLOGIC HISTORY:   Breast cancer of upper-outer quadrant of left female breast ([place])   09/28/2000 Initial Biopsy    Left lumpectomy for stage I breast cancer that was ER/PR negative followed by adjuvant radiation      09/20/2015 Initial Diagnosis    Left breast mass 3 cm 1:00 position: IDC grade 2, ER/PR negative, Ki-67 60%, HER-2 negative, T2 N0 stage IIA clinical stage      10/28/2015 Surgery    Left mastectomy: IDC grade 3, 3 cm, lymphovascular invasion present, margins negative, 0/6 lymph nodes, ER 0%, PR 0%, HER-2 negative, Ki-67 60%, T2 N0 stage II a      11/25/2015 -  Chemotherapy    Adjuvant chemotherapy with Dose dense Adriamycin and Cytoxan 4 followed by Taxol 12        CHIEF COMPLIANT: Cycle 12 of Taxol  INTERVAL HISTORY: Leah Mcdowell is a 61 year old with above-mentioned history of left breast cancer treated with mastectomy followed by adjuvant chemotherapy today is cycle 12 of Taxol. This is her last cycle of chemotherapy. She is very excited about it. Her major complaints of loss of taste and appetite. She also complains of fatigue.  REVIEW OF SYSTEMS:   Constitutional: Denies fevers, chills or abnormal weight loss Eyes: Denies blurriness of vision Ears, nose, mouth, throat, and face: Denies mucositis or sore throat Respiratory: Denies cough, dyspnea or wheezes Cardiovascular: Denies palpitation, chest discomfort Gastrointestinal:  Denies nausea, heartburn or change in bowel habits Skin: Denies abnormal skin rashes Lymphatics:  Denies new lymphadenopathy or easy bruising Neurological:Denies numbness, tingling or new weaknesses Behavioral/Psych: Mood is stable, no new changes  Extremities: No lower extremity edema  All other systems were reviewed with the patient and are negative.  I have reviewed the past medical history, past surgical history, social history and family history with the patient and they are unchanged from previous note.  ALLERGIES:  is allergic to no known allergies.  MEDICATIONS:  Current Outpatient Prescriptions  Medication Sig Dispense Refill  . LORazepam (ATIVAN) 1 MG tablet Take 1 tablet (1 mg total) by mouth every 8 (eight) hours as needed for anxiety (or nausea). 30 tablet 0  . venlafaxine XR (EFFEXOR-XR) 37.5 MG 24 hr capsule Take 1 capsule (37.5 mg total) by mouth daily with breakfast. 30 capsule 1   No current facility-administered medications for this visit.     PHYSICAL EXAMINATION: ECOG PERFORMANCE STATUS: 1 - Symptomatic but completely ambulatory  Vitals:   04/06/16 1039  BP: 126/87  Pulse: (!) 107  Resp: 18  Temp: 97.7 F (36.5 C)   Filed Weights   04/06/16 1039  Weight: 97 lb 8 oz (44.2 kg)    GENERAL:alert, no distress and comfortable SKIN: skin color, texture, turgor are normal, no rashes or significant lesions EYES: normal, Conjunctiva are pink and non-injected, sclera clear OROPHARYNX:no exudate, no erythema and lips, buccal mucosa, and tongue normal  NECK: supple, thyroid normal size, non-tender, without nodularity LYMPH:  no palpable lymphadenopathy in the cervical, axillary or inguinal LUNGS: clear to auscultation and percussion with normal breathing effort HEART: regular rate &  rhythm and no murmurs and no lower extremity edema ABDOMEN:abdomen soft, non-tender and normal bowel sounds MUSCULOSKELETAL:no cyanosis of digits and no clubbing  NEURO: alert & oriented x 3 with fluent speech, no focal motor/sensory deficits EXTREMITIES: No lower extremity  edema  LABORATORY DATA:  I have reviewed the data as listed   Chemistry      Component Value Date/Time   NA 139 03/30/2016 1012   K 3.8 03/30/2016 1012   CL 105 10/29/2015 0351   CO2 26 03/30/2016 1012   BUN 12.3 03/30/2016 1012   CREATININE 0.6 03/30/2016 1012      Component Value Date/Time   CALCIUM 9.5 03/30/2016 1012   ALKPHOS 96 03/30/2016 1012   AST 16 03/30/2016 1012   ALT 16 03/30/2016 1012   BILITOT 0.35 03/30/2016 1012       Lab Results  Component Value Date   WBC 6.4 04/06/2016   HGB 13.8 04/06/2016   HCT 40.7 04/06/2016   MCV 100.5 04/06/2016   PLT 330 04/06/2016   NEUTROABS 4.0 04/06/2016    ASSESSMENT & PLAN:  Breast cancer of upper-outer quadrant of left female breast ([place]) Left mastectomy 10/28/2015: IDC grade 3, 3 cm, lymphovascular invasion present, margins negative, 0/6 lymph nodes, ER 0%, PR 0%, HER-2 negative, Ki-67 60%, T2 N0 stage II a  Treatment plan: Adjuvant chemotherapy with dose dense Adriamycin and Cytoxan every 2 weeks 4 followed by weekly Taxol 12 No role of adjuvant radiation if the lymph nodes and margins are negative. No role for antiestrogen therapy since she is triple negative. ------------------------------------------------------------------------------------------------------------------------------------------------------------ Current treatment: Today's cycle 12Taxol. Echocardiogram 10/15/2015: EF 60-65% Labs have been reviewed thoroughly. Monitoring closely for chemotherapy toxicities  Chemotherapy toxicities: 1. Grade 3 neutropenia: We reducedthe dosage of cycle 2 of dose dense Adriamycin and Cytoxan 2. Nausea grade 2: She is tolerating Zofran better and has not required to much of it. 3. Fatigue grade 2: From day 3 today 5 profound fatigue. Gets better afterwards 4. Alopecia 5. Loss of taste and appetite: Patient needing 6-7 small meals every day 6. Weight loss: Patient is working with our dietitian  I decreased  the dosage of Taxol with cycle 4. I added Aloxi to her treatment.  Depression: OnEffexor.  This concludes her treatment. RTC in 3 months for follow up.   I spent 25 minutes talking to the patient of which more than half was spent in counseling and coordination of care.  Orders Placed This Encounter  Procedures  . Comprehensive metabolic panel    Standing Status:   Future    Standing Expiration Date:   04/06/2017  . CBC with Differential    Standing Status:   Future    Standing Expiration Date:   04/06/2017   The patient has a good understanding of the overall plan. she agrees with it. she will call with any problems that may develop before the next visit here.   Rulon Eisenmenger, MD 04/06/16

## 2016-04-06 NOTE — Progress Notes (Signed)
Nutrition follow-up completed with patient receiving chemotherapy for breast cancer. Weight decreased slightly and documented as 97.5 pounds February 15, down from 98.4 pounds January 19 Today is her last cycle of Taxol. She continues to have decreased taste and decreased appetite. She is no longer longer drinking oral nutrition supplements. Patient is not concerned with weight loss.  Nutrition diagnosis: Underweight continues.  Intervention: Provided support and encouragement for patient to increase oral intake to minimize further weight loss and promote preservation of lean body mass. Teach back method used.  Monitoring, evaluation, goals: Patient prefers to contact me for any further questions or concerns. No follow-up is scheduled.  **Disclaimer: This note was dictated with voice recognition software. Similar sounding words can inadvertently be transcribed and this note may contain transcription errors which may not have been corrected upon publication of note.**

## 2016-04-07 ENCOUNTER — Other Ambulatory Visit: Payer: BLUE CROSS/BLUE SHIELD

## 2016-04-07 ENCOUNTER — Ambulatory Visit: Payer: BLUE CROSS/BLUE SHIELD | Admitting: Hematology and Oncology

## 2016-04-07 ENCOUNTER — Ambulatory Visit: Payer: BLUE CROSS/BLUE SHIELD

## 2016-04-07 ENCOUNTER — Telehealth: Payer: Self-pay | Admitting: Adult Health

## 2016-04-07 NOTE — Telephone Encounter (Signed)
sch SCP appt per LOS. Letter mailed 2/16

## 2016-04-09 ENCOUNTER — Telehealth: Payer: Self-pay

## 2016-04-09 NOTE — Telephone Encounter (Signed)
Called and left a message the 2/15 los appts  Jaslen Adcox

## 2016-04-11 ENCOUNTER — Other Ambulatory Visit: Payer: Self-pay

## 2016-04-11 DIAGNOSIS — C50412 Malignant neoplasm of upper-outer quadrant of left female breast: Secondary | ICD-10-CM

## 2016-04-11 DIAGNOSIS — Z171 Estrogen receptor negative status [ER-]: Principal | ICD-10-CM

## 2016-04-12 ENCOUNTER — Telehealth: Payer: Self-pay | Admitting: Hematology and Oncology

## 2016-04-12 NOTE — Telephone Encounter (Signed)
lvm for pt to return call to office re genetics appt per LOS

## 2016-04-24 ENCOUNTER — Ambulatory Visit: Payer: Self-pay | Admitting: Surgery

## 2016-04-24 NOTE — H&P (Signed)
Leah Mcdowell 04/24/2016 10:20 AM Location: [facility] Patient #: [id number] DOB: [birth year] Married / Language: Leah Mcdowell / Race: White Female  History of Present Illness Leah Moores A. Lealand Elting MD; 04/24/2016 12:03 PM) Patient words: ltf br/ sch port removal     The patient has completed chemotherapy. She is ready for port removal. Her energy is improving.           Leah Mcdowell is a 61 year old with above-mentioned history of left breast cancer treated with mastectomy followed by adjuvant chemotherapy today is cycle 12 of Taxol. This is her last cycle of chemotherapy. She is very excited about it. Her major complaints of loss of taste and appetite. She also complains of fatigue.    Breast cancer of upper-outer quadrant of left female breast ([place]) Left mastectomy 10/28/2015: IDC grade 3, 3 cm, lymphovascular invasion present, margins negative, 0/6 lymph nodes, ER 0%, PR 0%, HER-2 negative, Ki-67 60%, T2 N0 stage II a.  The patient is a 61 year old female.   Problem List/Past Medical Leah Mcdowell, CMA; 04/24/2016 10:21 AM) BREAST CANCER, LEFT (C50.912) POST-OP PAIN (G89.18) POST-OPERATIVE STATE [phone])  Past Surgical History Leah Mcdowell, CMA; 04/24/2016 10:21 AM) Mastectomy Left.  Diagnostic Studies History Leah Mcdowell, CMA; 04/24/2016 10:21 AM) Colonoscopy never Pap Smear 1-5 years ago  Allergies Leah Mcdowell, CMA; 04/24/2016 10:21 AM) No Known Drug Allergies 11/10/2015  Medication History Leah Mcdowell, CMA; 04/24/2016 10:23 AM) Venlafaxine HCl ER (37.5MG Capsule ER 24HR, Oral) Active. Medications Reconciled  Social History Leah Mcdowell, CMA; 04/24/2016 10:23 AM) Alcohol use Moderate alcohol use. Caffeine use Coffee. No drug use Tobacco use Former smoker.  Family History Leah Mcdowell, CMA; 04/24/2016 10:23 AM) First Degree Relatives No pertinent family history  Pregnancy / Birth History Leah Lull  R. Rolena Mcdowell, CMA; 04/24/2016 10:23 AM) Age of menopause <45 Maternal age <15 Para 0    Vitals Leah Mcdowell CMA; 04/24/2016 10:21 AM) 04/24/2016 10:21 AM Weight: 97.5 lb Height: 68in Body Surface Area: 1.51 m Body Mass Index: 14.82 kg/m  BP: 120/76 (Sitting, Left Arm, Standard)      Physical Exam (Leah Meacham A. Draden Cottingham MD; 04/24/2016 12:04 PM)  General Mental Status-Alert. General Appearance-Consistent with stated age. Hydration-Well hydrated. Voice-Normal.  Head and Neck Head-normocephalic, atraumatic with no lesions or palpable masses.  Chest and Lung Exam Note: For the right sided intact.  Breast Note: Left breast surgically absent. No signs of infection or abnormality.  Musculoskeletal Normal Exam - Left-Upper Extremity Strength Normal and Lower Extremity Strength Normal. Normal Exam - Right-Upper Extremity Strength Normal, Lower Extremity Weakness.    Assessment & Plan (Leah Weight A. Darnetta Kesselman MD; 04/24/2016 10:56 AM)  HISTORY OF BREAST CANCER (Z85.3) Impression: pt ready for port removal  Current Plans I recommended surgery to remove the catheter. I explained the technique of removal with use of local anesthesia & possible need for more aggressive sedation/anesthesia for patient comfort.  Risks such as bleeding, infection, and other risks were discussed. Post-operative dressing/incision care was discussed. I noted a good likelihood this will help address the problem. We will work to minimize complications. Questions were answered. The patient expresses understanding & wishes to proceed with surgery.  Pt Education - [facility] Free Text Education/Instructions: discussed with patient and provided information.

## 2016-04-24 NOTE — H&P (Signed)
Leah Mcdowell 04/24/2016 10:20 AM Location: [facility] Patient #: [id number] DOB: 1955-06-25 Married / Language: [name] / Race: White Female  History of Present Illness [name] Dalyce Renne MD; 04/24/2016 12:03 PM) Patient words: ltf br/ sch port removal     The patient has completed chemotherapy. She is ready for port removal. Her energy is improving.           Leah Mcdowell is a 61 year old with above-mentioned history of left breast cancer treated with mastectomy followed by adjuvant chemotherapy today is cycle 12 of Taxol. This is her last cycle of chemotherapy. She is very excited about it. Her major complaints of loss of taste and appetite. She also complains of fatigue.    Breast cancer of upper-outer quadrant of left female breast ([place]) Left mastectomy 10/28/2015: IDC grade 3, 3 cm, lymphovascular invasion present, margins negative, 0/6 lymph nodes, ER 0%, PR 0%, HER-2 negative, Ki-67 60%, T2 N0 stage II a.  The patient is a 61 year old female.   Problem List/Past Medical Sharyn Lull R. Brooks, CMA; 04/24/2016 10:21 AM) BREAST CANCER, LEFT (C50.912) POST-OP PAIN (G89.18) POST-OPERATIVE STATE [phone])  Past Surgical History Sharyn Lull R. Brooks, CMA; 04/24/2016 10:21 AM) Mastectomy Left.  Diagnostic Studies History Sharyn Lull R. Brooks, CMA; 04/24/2016 10:21 AM) Colonoscopy never Pap Smear 1-5 years ago  Allergies Sharyn Lull R. Brooks, CMA; 04/24/2016 10:21 AM) No Known Drug Allergies 11/10/2015  Medication History Sharyn Lull R. Brooks, CMA; 04/24/2016 10:23 AM) Venlafaxine HCl ER (37.5MG Capsule ER 24HR, Oral) Active. Medications Reconciled  Social History Sharyn Lull R. Brooks, CMA; 04/24/2016 10:23 AM) Alcohol use Moderate alcohol use. Caffeine use Coffee. No drug use Tobacco use Former smoker.  Family History Sharyn Lull R. Rolena Infante, CMA; 04/24/2016 10:23 AM) First Degree Relatives No pertinent family history  Pregnancy / Birth History Sharyn Lull  R. Rolena Infante, CMA; 04/24/2016 10:23 AM) Age of menopause <45 Maternal age <15 Para 0    Vitals Sharyn Lull R. Brooks CMA; 04/24/2016 10:21 AM) 04/24/2016 10:21 AM Weight: 97.5 lb Height: 68in Body Surface Area: 1.51 m Body Mass Index: 14.82 kg/m  BP: 120/76 (Sitting, Left Arm, Standard)      Physical Exam (Leah Mcdowell A. Tynetta Bachmann MD; 04/24/2016 12:04 PM)  General Mental Status-Alert. General Appearance-Consistent with stated age. Hydration-Well hydrated. Voice-Normal.  Head and Neck Head-normocephalic, atraumatic with no lesions or palpable masses.  Chest and Lung Exam Note: For the right sided intact.  Breast Note: Left breast surgically absent. No signs of infection or abnormality.  Musculoskeletal Normal Exam - Left-Upper Extremity Strength Normal and Lower Extremity Strength Normal. Normal Exam - Right-Upper Extremity Strength Normal, Lower Extremity Weakness.    Assessment & Plan (Rosalia Mcavoy A. Malaysha Arlen MD; 04/24/2016 10:56 AM)  HISTORY OF BREAST CANCER (Z85.3) Impression: pt ready for port removal  Current Plans I recommended surgery to remove the catheter. I explained the technique of removal with use of local anesthesia & possible need for more aggressive sedation/anesthesia for patient comfort.  Risks such as bleeding, infection, and other risks were discussed. Post-operative dressing/incision care was discussed. I noted a good likelihood this will help address the problem. We will work to minimize complications. Questions were answered. The patient expresses understanding & wishes to proceed with surgery.  Pt Education - [facility] Free Text Education/Instructions: discussed with patient and provided information.

## 2016-04-26 ENCOUNTER — Encounter (HOSPITAL_BASED_OUTPATIENT_CLINIC_OR_DEPARTMENT_OTHER): Payer: Self-pay | Admitting: *Deleted

## 2016-05-02 ENCOUNTER — Ambulatory Visit (HOSPITAL_BASED_OUTPATIENT_CLINIC_OR_DEPARTMENT_OTHER)
Admission: RE | Admit: 2016-05-02 | Discharge: 2016-05-02 | Disposition: A | Discharge status: Home / Self Care | Payer: BLUE CROSS/BLUE SHIELD | Source: Ambulatory Visit | Attending: Surgery | Admitting: Surgery

## 2016-05-02 ENCOUNTER — Ambulatory Visit (HOSPITAL_BASED_OUTPATIENT_CLINIC_OR_DEPARTMENT_OTHER): Payer: BLUE CROSS/BLUE SHIELD | Admitting: Anesthesiology

## 2016-05-02 ENCOUNTER — Encounter (HOSPITAL_BASED_OUTPATIENT_CLINIC_OR_DEPARTMENT_OTHER)
Admission: RE | Disposition: A | Discharge status: Home / Self Care | Payer: Self-pay | Source: Ambulatory Visit | Attending: Surgery

## 2016-05-02 ENCOUNTER — Encounter (HOSPITAL_BASED_OUTPATIENT_CLINIC_OR_DEPARTMENT_OTHER): Payer: Self-pay | Admitting: Anesthesiology

## 2016-05-02 DIAGNOSIS — Z87891 Personal history of nicotine dependence: Secondary | ICD-10-CM | POA: Diagnosis not present

## 2016-05-02 DIAGNOSIS — Z853 Personal history of malignant neoplasm of breast: Secondary | ICD-10-CM | POA: Insufficient documentation

## 2016-05-02 DIAGNOSIS — Z4689 Encounter for fitting and adjustment of other specified devices: Secondary | ICD-10-CM | POA: Diagnosis present

## 2016-05-02 DIAGNOSIS — G709 Myoneural disorder, unspecified: Secondary | ICD-10-CM | POA: Insufficient documentation

## 2016-05-02 DIAGNOSIS — Z9221 Personal history of antineoplastic chemotherapy: Secondary | ICD-10-CM | POA: Insufficient documentation

## 2016-05-02 DIAGNOSIS — Z79899 Other long term (current) drug therapy: Secondary | ICD-10-CM | POA: Diagnosis not present

## 2016-05-02 DIAGNOSIS — Z452 Encounter for adjustment and management of vascular access device: Secondary | ICD-10-CM | POA: Insufficient documentation

## 2016-05-02 HISTORY — PX: PORT-A-CATH REMOVAL: SHX5289

## 2016-05-02 SURGERY — REMOVAL PORT-A-CATH
Anesthesia: Monitor Anesthesia Care | Site: Chest | Laterality: Right

## 2016-05-02 MED ORDER — MIDAZOLAM HCL 2 MG/2ML IJ SOLN: 1.0000 mg | INTRAMUSCULAR | Status: DC | PRN

## 2016-05-02 MED ORDER — LIDOCAINE 2% (20 MG/ML) 5 ML SYRINGE
INTRAMUSCULAR | Status: AC
  Filled 2016-05-02: qty 5

## 2016-05-02 MED ORDER — FENTANYL CITRATE (PF) 100 MCG/2ML IJ SOLN
INTRAMUSCULAR | Status: AC
  Filled 2016-05-02: qty 2

## 2016-05-02 MED ORDER — EPHEDRINE 5 MG/ML INJ
INTRAVENOUS | Status: AC
  Filled 2016-05-02: qty 10

## 2016-05-02 MED ORDER — PROPOFOL 10 MG/ML IV BOLUS
INTRAVENOUS | Status: DC | PRN
  Administered 2016-05-02: 20 mg via INTRAVENOUS
  Administered 2016-05-02: 50 mg via INTRAVENOUS

## 2016-05-02 MED ORDER — ONDANSETRON HCL 4 MG/2ML IJ SOLN
INTRAMUSCULAR | Status: AC
Start: 2016-05-02 — End: 2016-05-02
  Filled 2016-05-02: qty 2

## 2016-05-02 MED ORDER — PROMETHAZINE HCL 25 MG/ML IJ SOLN: 6.2500 mg | INTRAMUSCULAR | Status: DC | PRN

## 2016-05-02 MED ORDER — MIDAZOLAM HCL 2 MG/2ML IJ SOLN
INTRAMUSCULAR | Status: DC | PRN
  Administered 2016-05-02: 2 mg via INTRAVENOUS

## 2016-05-02 MED ORDER — HYDROMORPHONE HCL 1 MG/ML IJ SOLN: 0.2500 mg | INTRAMUSCULAR | Status: DC | PRN

## 2016-05-02 MED ORDER — MIDAZOLAM HCL 2 MG/2ML IJ SOLN
INTRAMUSCULAR | Status: AC
  Filled 2016-05-02: qty 2

## 2016-05-02 MED ORDER — PHENYLEPHRINE HCL 10 MG/ML IJ SOLN
INTRAMUSCULAR | Status: DC | PRN
  Administered 2016-05-02: 80 ug via INTRAVENOUS

## 2016-05-02 MED ORDER — IBUPROFEN 800 MG PO TABS: 800.0000 mg | ORAL_TABLET | Freq: Three times a day (TID) | ORAL | 0 refills | Status: DC | PRN

## 2016-05-02 MED ORDER — ONDANSETRON HCL 4 MG/2ML IJ SOLN
INTRAMUSCULAR | Status: DC | PRN
Start: 2016-05-02 — End: 2016-05-02
  Administered 2016-05-02: 4 mg via INTRAVENOUS

## 2016-05-02 MED ORDER — BUPIVACAINE HCL (PF) 0.25 % IJ SOLN
INTRAMUSCULAR | Status: AC
  Filled 2016-05-02: qty 30

## 2016-05-02 MED ORDER — SCOPOLAMINE 1 MG/3DAYS TD PT72: 1.0000 | MEDICATED_PATCH | Freq: Once | TRANSDERMAL | Status: DC | PRN

## 2016-05-02 MED ORDER — PROPOFOL 10 MG/ML IV BOLUS
INTRAVENOUS | Status: AC
  Filled 2016-05-02: qty 40

## 2016-05-02 MED ORDER — EPHEDRINE SULFATE 50 MG/ML IJ SOLN
INTRAMUSCULAR | Status: DC | PRN
  Administered 2016-05-02: 5 mg via INTRAVENOUS

## 2016-05-02 MED ORDER — PHENYLEPHRINE 40 MCG/ML (10ML) SYRINGE FOR IV PUSH (FOR BLOOD PRESSURE SUPPORT)
PREFILLED_SYRINGE | INTRAVENOUS | Status: AC
Start: 2016-05-02 — End: 2016-05-02
  Filled 2016-05-02: qty 10

## 2016-05-02 MED ORDER — BUPIVACAINE HCL (PF) 0.25 % IJ SOLN
INTRAMUSCULAR | Status: DC | PRN
  Administered 2016-05-02: 10 mL

## 2016-05-02 MED ORDER — CHLORHEXIDINE GLUCONATE CLOTH 2 % EX PADS: 6.0000 | MEDICATED_PAD | Freq: Once | CUTANEOUS | Status: DC

## 2016-05-02 MED ORDER — CEFAZOLIN SODIUM-DEXTROSE 2-4 GM/100ML-% IV SOLN
INTRAVENOUS | Status: AC
  Filled 2016-05-02: qty 100

## 2016-05-02 MED ORDER — FENTANYL CITRATE (PF) 100 MCG/2ML IJ SOLN: 50.0000 ug | INTRAMUSCULAR | Status: DC | PRN

## 2016-05-02 MED ORDER — LACTATED RINGERS IV SOLN
INTRAVENOUS | Status: DC
  Administered 2016-05-02: 08:00:00 via INTRAVENOUS

## 2016-05-02 MED ORDER — DEXTROSE 5 % IV SOLN
3.0000 g | INTRAVENOUS | Status: AC
  Administered 2016-05-02: 2 g via INTRAVENOUS

## 2016-05-02 MED ORDER — DEXAMETHASONE SODIUM PHOSPHATE 10 MG/ML IJ SOLN
INTRAMUSCULAR | Status: AC
  Filled 2016-05-02: qty 1

## 2016-05-02 MED ORDER — FENTANYL CITRATE (PF) 100 MCG/2ML IJ SOLN
INTRAMUSCULAR | Status: DC | PRN
  Administered 2016-05-02: 100 ug via INTRAVENOUS

## 2016-05-02 SURGICAL SUPPLY — 36 items
BENZOIN TINCTURE PRP APPL 2/3 (GAUZE/BANDAGES/DRESSINGS) IMPLANT
BLADE SURG 15 STRL LF DISP TIS (BLADE) ×1 IMPLANT
BLADE SURG 15 STRL SS (BLADE) ×2
CHLORAPREP W/TINT 26ML (MISCELLANEOUS) ×3 IMPLANT
CLOSURE WOUND 1/2 X4 (GAUZE/BANDAGES/DRESSINGS)
COVER BACK TABLE 60X90IN (DRAPES) ×3 IMPLANT
COVER MAYO STAND STRL (DRAPES) ×3 IMPLANT
DECANTER SPIKE VIAL GLASS SM (MISCELLANEOUS) IMPLANT
DERMABOND ADVANCED (GAUZE/BANDAGES/DRESSINGS) ×2
DERMABOND ADVANCED .7 DNX12 (GAUZE/BANDAGES/DRESSINGS) ×1 IMPLANT
DRAPE LAPAROTOMY 100X72 PEDS (DRAPES) ×3 IMPLANT
DRAPE UTILITY XL STRL (DRAPES) ×3 IMPLANT
ELECT REM PT RETURN 9FT ADLT (ELECTROSURGICAL)
ELECTRODE REM PT RTRN 9FT ADLT (ELECTROSURGICAL) IMPLANT
GLOVE BIOGEL PI IND STRL 7.0 (GLOVE) ×1 IMPLANT
GLOVE BIOGEL PI IND STRL 8 (GLOVE) ×1 IMPLANT
GLOVE BIOGEL PI INDICATOR 7.0 (GLOVE) ×2
GLOVE BIOGEL PI INDICATOR 8 (GLOVE) ×2
GLOVE ECLIPSE 8.0 STRL XLNG CF (GLOVE) ×3 IMPLANT
GLOVE EXAM NITRILE EXT CUFF MD (GLOVE) ×3 IMPLANT
GLOVE SURG SYN 7.5  E (GLOVE) ×2
GLOVE SURG SYN 7.5 E (GLOVE) ×1 IMPLANT
GOWN STRL REUS W/ TWL LRG LVL3 (GOWN DISPOSABLE) ×2 IMPLANT
GOWN STRL REUS W/TWL LRG LVL3 (GOWN DISPOSABLE) ×4
NEEDLE HYPO 25X1 1.5 SAFETY (NEEDLE) ×3 IMPLANT
NS IRRIG 1000ML POUR BTL (IV SOLUTION) IMPLANT
PACK BASIN DAY SURGERY FS (CUSTOM PROCEDURE TRAY) ×3 IMPLANT
PENCIL BUTTON HOLSTER BLD 10FT (ELECTRODE) IMPLANT
SLEEVE SCD COMPRESS KNEE MED (MISCELLANEOUS) IMPLANT
SPONGE LAP 4X18 X RAY DECT (DISPOSABLE) ×3 IMPLANT
STRIP CLOSURE SKIN 1/2X4 (GAUZE/BANDAGES/DRESSINGS) IMPLANT
SUT MON AB 4-0 PC3 18 (SUTURE) ×3 IMPLANT
SUT VICRYL 3-0 CR8 SH (SUTURE) ×3 IMPLANT
SYR CONTROL 10ML LL (SYRINGE) ×3 IMPLANT
TOWEL OR 17X24 6PK STRL BLUE (TOWEL DISPOSABLE) ×3 IMPLANT
TOWEL OR NON WOVEN STRL DISP B (DISPOSABLE) IMPLANT

## 2016-05-02 NOTE — Discharge Instructions (Signed)
GENERAL SURGERY: POST OP INSTRUCTIONS ° °###################################################################### ° °EAT °Gradually transition to a high fiber diet with a fiber supplement over the next few weeks after discharge.  Start with a pureed / full liquid diet (see below) ° °WALK °Walk an hour a day.  Control your pain to do that.   ° °CONTROL PAIN °Control pain so that you can walk, sleep, tolerate sneezing/coughing, go up/down stairs. ° °HAVE A BOWEL MOVEMENT DAILY °Keep your bowels regular to avoid problems.  OK to try a laxative to override constipation.  OK to use an antidairrheal to slow down diarrhea.  Call if not better after 2 tries ° °CALL IF YOU HAVE PROBLEMS/CONCERNS °Call if you are still struggling despite following these instructions. °Call if you have concerns not answered by these instructions ° °###################################################################### ° ° ° °1. DIET: Follow a light bland diet the first 24 hours after arrival home, such as soup, liquids, crackers, etc.  Be sure to include lots of fluids daily.  Avoid fast food or heavy meals as your are more likely to get nauseated.   °2. Take your usually prescribed home medications unless otherwise directed. °3. PAIN CONTROL: °a. Pain is best controlled by a usual combination of three different methods TOGETHER: °i. Ice/Heat °ii. Over the counter pain medication °iii. Prescription pain medication °b. Most patients will experience some swelling and bruising around the incisions.  Ice packs or heating pads (30-60 minutes up to 6 times a day) will help. Use ice for the first few days to help decrease swelling and bruising, then switch to heat to help relax tight/sore spots and speed recovery.  Some people prefer to use ice alone, heat alone, alternating between ice & heat.  Experiment to what works for you.  Swelling and bruising can take several weeks to resolve.   °c. It is helpful to take an over-the-counter pain medication  regularly for the first few weeks.  Choose one of the following that works best for you: °i. Naproxen (Aleve, etc)  Two 220mg tabs twice a day °ii. Ibuprofen (Advil, etc) Three 200mg tabs four times a day (every meal & bedtime) °iii. Acetaminophen (Tylenol, etc) 500-650mg four times a day (every meal & bedtime) °d. A  prescription for pain medication (such as oxycodone, hydrocodone, etc) should be given to you upon discharge.  Take your pain medication as prescribed.  °i. If you are having problems/concerns with the prescription medicine (does not control pain, nausea, vomiting, rash, itching, etc), please call us [phone] to see if we need to switch you to a different pain medicine that will work better for you and/or control your side effect better. °ii. If you need a refill on your pain medication, please contact your pharmacy.  They will contact our office to request authorization. Prescriptions will not be filled after 5 pm or on week-ends. °4. Avoid getting constipated.  Between the surgery and the pain medications, it is common to experience some constipation.  Increasing fluid intake and taking a fiber supplement (such as Metamucil, Citrucel, FiberCon, MiraLax, etc) 1-2 times a day regularly will usually help prevent this problem from occurring.  A mild laxative (prune juice, Milk of Magnesia, MiraLax, etc) should be taken according to package directions if there are no bowel movements after 48 hours.   °5. Wash / shower every day.  You may shower over the dressings as they are waterproof.  Continue to shower over incision(s) after the dressing is off. °6. Remove your waterproof bandages   5 days after surgery.  You may leave the incision open to air.  You may have skin tapes (Steri Strips) covering the incision(s).  Leave them on until one week, then remove.  You may replace a dressing/Band-Aid to cover the incision for comfort if you wish.      7. ACTIVITIES as tolerated:   a. You may resume  regular (light) daily activities beginning the next day--such as daily self-care, walking, climbing stairs--gradually increasing activities as tolerated.  If you can walk 30 minutes without difficulty, it is safe to try more intense activity such as jogging, treadmill, bicycling, low-impact aerobics, swimming, etc. b. Save the most intensive and strenuous activity for last such as sit-ups, heavy lifting, contact sports, etc  Refrain from any heavy lifting or straining until you are off narcotics for pain control.   c. DO NOT PUSH THROUGH PAIN.  Let pain be your guide: If it hurts to do something, don't do it.  Pain is your body warning you to avoid that activity for another week until the pain goes down. d. You may drive when you are no longer taking prescription pain medication, you can comfortably wear a seatbelt, and you can safely maneuver your car and apply brakes. e. Dennis Bast may have sexual intercourse when it is comfortable.  8. FOLLOW UP in our office a. Please call [facility] at [phone] to set up an appointment to see your surgeon in the office for a follow-up appointment approximately 2-3 weeks after your surgery. b. Make sure that you call for this appointment the day you arrive home to insure a convenient appointment time. 9. IF YOU HAVE DISABILITY OR FAMILY LEAVE FORMS, BRING THEM TO THE OFFICE FOR PROCESSING.  DO NOT GIVE THEM TO YOUR DOCTOR.   WHEN TO CALL us [phone]: 1. Poor pain control 2. Reactions / problems with new medications (rash/itching, nausea, etc)  3. Fever over 101.5 F (38.5 C) 4. Worsening swelling or bruising 5. Continued bleeding from incision. 6. Increased pain, redness, or drainage from the incision 7. Difficulty breathing / swallowing   The clinic staff is available to answer your questions during regular business hours (8:30am-5pm).  Please dont hesitate to call and ask to speak to one of our nurses for clinical concerns.   If you have a medical emergency,  go to the nearest emergency room or call 911.  A surgeon from [facility] is always on call at the [facility], [place], [place], [place],   [address] ? MAIN: [phone] ? TOLL FREE: [phone] ?  FAX [phone] [email or web address]   Post Anesthesia Home Care Instructions  Activity: Get plenty of rest for the remainder of the day. A responsible adult should stay with you for 24 hours following the procedure.  For the next 24 hours, DO NOT: -Drive a car -Paediatric nurse -Drink alcoholic beverages -Take any medication unless instructed by your physician -Make any legal decisions or sign important papers.  Meals: Start with liquid foods such as gelatin or soup. Progress to regular foods as tolerated. Avoid greasy, spicy, heavy foods. If nausea and/or vomiting occur, drink only clear liquids until the nausea and/or vomiting subsides. Call your physician if vomiting continues.  Special Instructions/Symptoms: Your throat may feel dry or sore from the anesthesia or the breathing tube placed in your throat during surgery. If this causes discomfort, gargle with warm salt water. The discomfort should disappear within 24 hours.  If you had a scopolamine patch placed behind your ear for the management of post- operative nausea and/or vomiting: ° °1. The medication in the patch is effective for 72 hours, after which it should be removed.  Wrap patch in a tissue and discard in the trash. Wash hands thoroughly with soap and water. °2. You may remove the patch earlier than 72 hours if you experience unpleasant side effects which may include dry mouth, dizziness or visual disturbances. °3. Avoid touching the patch. Wash your hands with soap and water after contact with the patch. °  ° °

## 2016-05-02 NOTE — Anesthesia Preprocedure Evaluation (Addendum)
Anesthesia Evaluation  Patient identified by MRN, date of birth, ID band Patient awake    Reviewed: Allergy & Precautions, NPO status , Patient's Chart, lab work & pertinent test results  Airway Mallampati: II  TM Distance: >3 FB Neck ROM: Full    Dental   Pulmonary former smoker,    breath sounds clear to auscultation       Cardiovascular negative cardio ROS   Rhythm:Regular Rate:Normal     Neuro/Psych  Neuromuscular disease    GI/Hepatic negative GI ROS, Neg liver ROS,   Endo/Other  negative endocrine ROS  Renal/GU negative Renal ROS     Musculoskeletal   Abdominal   Peds  Hematology   Anesthesia Other Findings   Reproductive/Obstetrics                            Anesthesia Physical Anesthesia Plan  ASA: II  Anesthesia Plan: MAC   Post-op Pain Management:    Induction: Intravenous  Airway Management Planned: Natural Airway and Simple Face Mask  Additional Equipment:   Intra-op Plan:   Post-operative Plan:   Informed Consent: I have reviewed the patients History and Physical, chart, labs and discussed the procedure including the risks, benefits and alternatives for the proposed anesthesia with the patient or authorized representative who has indicated his/her understanding and acceptance.     Plan Discussed with: CRNA  Anesthesia Plan Comments:         Anesthesia Quick Evaluation

## 2016-05-02 NOTE — Op Note (Signed)
Preop diagnosis: Indwelling port a catheter for chemotherapy  Postop diagnosis: Same  Procedure: Removal of port a catheter  Surgeon: Lillyona Polasek M.D.  Anesthesia: MAC with local  EBL: Minimal  Specimen none  Drains: None  Indications for procedure: The patient presents for removal of port a catheter after completing chemotherapy. The patient no longer requires central venous access. Risks of bleeding, infection, catheter fragmentation, embolization, arrhythmias and damage to arteries, veins and nerves and possibly other mediastinal structures discussed. The patient agrees to proceed.  Description of procedure: The patient was seen in the holding area. Questions were answered. The patient agreed to proceed. The patient was taken to the operating room. The patient was placed supine. Anesthesia was initiated. The skin on the upper chest was prepped and draped in a sterile fashion. Timeout was done. The patient received preoperative antibiotics. Incision was made through the old port site and the hub of the Port-A-Cath was seen. The sutures were cut to release the port from the chest wall. The catheter was removed in its entirety without difficulty. The tract was closed with 3-0 Vicryl. 4 Monocryl was used to close the skin. All final counts were correct. The patient was taken to recovery in satisfactory condition.  

## 2016-05-02 NOTE — Transfer of Care (Signed)
Immediate Anesthesia Transfer of Care Note  Patient: Leah Mcdowell  Procedure(s) Performed: Procedure(s): REMOVAL PORT-A-CATH (Right)  Patient Location: PACU  Anesthesia Type:MAC  Level of Consciousness: awake and patient cooperative  Airway & Oxygen Therapy: Patient Spontanous Breathing and Patient connected to face mask oxygen  Post-op Assessment: Report given to RN and Post -op Vital signs reviewed and stable  Post vital signs: Reviewed and stable  Last Vitals:  Vitals:   05/02/16 0625  BP: 113/83  Pulse: (!) 104  Resp: 18  Temp: 36.4 C    Last Pain:  Vitals:   05/02/16 0625  TempSrc: Oral      Patients Stated Pain Goal: 0 (68/86/48 [id number])  Complications: No apparent anesthesia complications

## 2016-05-02 NOTE — H&P (View-Only) (Signed)
CORLEY KOHLS 04/24/2016 10:20 AM Location: [facility] Patient #: [id number] DOB: January 04, 1956 Married / Language: Leah Mcdowell / Race: White Female  History of Present Illness Leah Mcdowell A. Leah Bow MD; 04/24/2016 12:03 PM) Patient words: ltf br/ sch port removal     The patient has completed chemotherapy. She is ready for port removal. Her energy is improving.           Leah Mcdowell is a 61 year old with above-mentioned history of left breast cancer treated with mastectomy followed by adjuvant chemotherapy today is cycle 12 of Taxol. This is her last cycle of chemotherapy. She is very excited about it. Her major complaints of loss of taste and appetite. She also complains of fatigue.    Breast cancer of upper-outer quadrant of left female breast ([place]) Left mastectomy 10/28/2015: IDC grade 3, 3 cm, lymphovascular invasion present, margins negative, 0/6 lymph nodes, ER 0%, PR 0%, HER-2 negative, Ki-67 60%, T2 N0 stage II a.  The patient is a 61 year old female.   Problem List/Past Medical Leah Mcdowell, CMA; 04/24/2016 10:21 AM) BREAST CANCER, LEFT (C50.912) POST-OP PAIN (G89.18) POST-OPERATIVE STATE [phone])  Past Surgical History Leah Mcdowell, CMA; 04/24/2016 10:21 AM) Mastectomy Left.  Diagnostic Studies History Leah Mcdowell, CMA; 04/24/2016 10:21 AM) Colonoscopy never Pap Smear 1-5 years ago  Allergies Leah Mcdowell, CMA; 04/24/2016 10:21 AM) No Known Drug Allergies 11/10/2015  Medication History Leah Mcdowell, CMA; 04/24/2016 10:23 AM) Venlafaxine HCl ER (37.5MG Capsule ER 24HR, Oral) Active. Medications Reconciled  Social History Leah Mcdowell, CMA; 04/24/2016 10:23 AM) Alcohol use Moderate alcohol use. Caffeine use Coffee. No drug use Tobacco use Former smoker.  Family History Leah Lull R. Rolena Infante, CMA; 04/24/2016 10:23 AM) First Degree Relatives No pertinent family history  Pregnancy / Birth History Leah Lull  R. Rolena Infante, CMA; 04/24/2016 10:23 AM) Age of menopause <45 Maternal age <15 Para 0    Vitals Leah Mcdowell CMA; 04/24/2016 10:21 AM) 04/24/2016 10:21 AM Weight: 97.5 lb Height: 68in Body Surface Area: 1.51 m Body Mass Index: 14.82 kg/m  BP: 120/76 (Sitting, Left Arm, Standard)      Physical Exam (Leah Mcdowell A. Leah Reames MD; 04/24/2016 12:04 PM)  General Mental Status-Alert. General Appearance-Consistent with stated age. Hydration-Well hydrated. Voice-Normal.  Head and Neck Head-normocephalic, atraumatic with no lesions or palpable masses.  Chest and Lung Exam Note: For the right sided intact.  Breast Note: Left breast surgically absent. No signs of infection or abnormality.  Musculoskeletal Normal Exam - Left-Upper Extremity Strength Normal and Lower Extremity Strength Normal. Normal Exam - Right-Upper Extremity Strength Normal, Lower Extremity Weakness.    Assessment & Plan (Leah Mcdowell A. Leah Sudano MD; 04/24/2016 10:56 AM)  HISTORY OF BREAST CANCER (Z85.3) Impression: pt ready for port removal  Current Plans I recommended surgery to remove the catheter. I explained the technique of removal with use of local anesthesia & possible need for more aggressive sedation/anesthesia for patient comfort.  Risks such as bleeding, infection, and other risks were discussed. Post-operative dressing/incision care was discussed. I noted a good likelihood this will help address the problem. We will work to minimize complications. Questions were answered. The patient expresses understanding & wishes to proceed with surgery.  Pt Education - [employer] Free Text Education/Instructions: discussed with patient and provided information.

## 2016-05-02 NOTE — Anesthesia Postprocedure Evaluation (Signed)
Anesthesia Post Note  Patient: Leah Mcdowell  Procedure(s) Performed: Procedure(s) (LRB): REMOVAL PORT-A-CATH (Right)  Patient location during evaluation: PACU Anesthesia Type: MAC Level of consciousness: awake and alert Pain management: pain level controlled Vital Signs Assessment: post-procedure vital signs reviewed and stable Respiratory status: spontaneous breathing, nonlabored ventilation, respiratory function stable and patient connected to nasal cannula oxygen Cardiovascular status: stable and blood pressure returned to baseline Anesthetic complications: no       Last Vitals:  Vitals:   05/02/16 0821 05/02/16 0829  BP: 102/76 105/79  Pulse: 82 79  Resp: 12 16  Temp:  36.4 C    Last Pain:  Vitals:   05/02/16 0829  TempSrc: Oral  PainSc: 0-No pain                 Catalina Gravel

## 2016-05-02 NOTE — Interval H&P Note (Signed)
History and Physical Interval Note:  05/02/2016 7:17 AM  Leah Mcdowell  has presented today for surgery, with the diagnosis of BREAST CANCER  The various methods of treatment have been discussed with the patient and family. After consideration of risks, benefits and other options for treatment, the patient has consented to  Procedure(s): REMOVAL PORT-A-CATH (N/A) as a surgical intervention .  The patient's history has been reviewed, patient examined, no change in status, stable for surgery.  I have reviewed the patient's chart and labs.  Questions were answered to the patient's satisfaction.     [name] A.

## 2016-05-03 ENCOUNTER — Encounter (HOSPITAL_BASED_OUTPATIENT_CLINIC_OR_DEPARTMENT_OTHER): Payer: Self-pay | Admitting: Surgery

## 2016-05-12 ENCOUNTER — Telehealth: Payer: Self-pay | Admitting: Hematology and Oncology

## 2016-05-12 NOTE — Telephone Encounter (Signed)
Patient unavailable . Left message with husband about new Genetics appt adde for 4/5 per sch msg from [employer].

## 2016-05-16 ENCOUNTER — Telehealth: Payer: Self-pay | Admitting: Hematology and Oncology

## 2016-05-16 NOTE — Telephone Encounter (Signed)
Patient called to cancel genetics andn SCP appointments per work availability. No intent to reschedule at this time. °

## 2016-05-25 ENCOUNTER — Other Ambulatory Visit: Payer: BLUE CROSS/BLUE SHIELD

## 2016-05-25 ENCOUNTER — Encounter: Payer: BLUE CROSS/BLUE SHIELD | Admitting: Genetics

## 2016-06-01 ENCOUNTER — Encounter: Payer: BLUE CROSS/BLUE SHIELD | Admitting: Adult Health

## 2016-07-04 ENCOUNTER — Encounter: Payer: Self-pay | Admitting: Hematology and Oncology

## 2016-07-04 ENCOUNTER — Ambulatory Visit (HOSPITAL_BASED_OUTPATIENT_CLINIC_OR_DEPARTMENT_OTHER): Payer: BLUE CROSS/BLUE SHIELD | Admitting: Hematology and Oncology

## 2016-07-04 ENCOUNTER — Other Ambulatory Visit (HOSPITAL_BASED_OUTPATIENT_CLINIC_OR_DEPARTMENT_OTHER): Payer: BLUE CROSS/BLUE SHIELD

## 2016-07-04 DIAGNOSIS — Z171 Estrogen receptor negative status [ER-]: Secondary | ICD-10-CM

## 2016-07-04 DIAGNOSIS — C50412 Malignant neoplasm of upper-outer quadrant of left female breast: Secondary | ICD-10-CM

## 2016-07-04 LAB — COMPREHENSIVE METABOLIC PANEL
ALBUMIN: 4.5 g/dL (ref 3.5–5.0)
ALT: 18 U/L (ref 0–55)
AST: 21 U/L (ref 5–34)
Alkaline Phosphatase: 111 U/L (ref 40–150)
Anion Gap: 12 mEq/L — ABNORMAL HIGH (ref 3–11)
BILIRUBIN TOTAL: 0.7 mg/dL (ref 0.20–1.20)
BUN: 9.8 mg/dL (ref 7.0–26.0)
CALCIUM: 9.8 mg/dL (ref 8.4–10.4)
CHLORIDE: 101 meq/L (ref 98–109)
CO2: 25 mEq/L (ref 22–29)
CREATININE: 0.7 mg/dL (ref 0.6–1.1)
EGFR: >90 mL/min/{1.73_m2} (ref >90)
Glucose: 92 mg/dl (ref 70–140)
Potassium: 4.7 mEq/L (ref 3.5–5.1)
Sodium: 138 mEq/L (ref 136–145)
Total Protein: 7.1 g/dL (ref 6.4–8.3)

## 2016-07-04 LAB — CBC WITH DIFFERENTIAL/PLATELET
BASO%: 1.3 % (ref 0.0–2.0)
Basophils Absolute: 0.1 10*3/uL (ref 0.0–0.1)
EOS%: 1.4 % (ref 0.0–7.0)
Eosinophils Absolute: 0.1 10*3/uL (ref 0.0–0.5)
HEMATOCRIT: 45.1 % (ref 34.8–46.6)
HEMOGLOBIN: 15.5 g/dL (ref 11.6–15.9)
LYMPH%: 22.4 % (ref 14.0–49.7)
MCH: 33.7 pg (ref 25.1–34.0)
MCHC: 34.5 g/dL (ref 31.5–36.0)
MCV: 97.6 fL (ref 79.5–101.0)
MONO#: 0.9 10*3/uL (ref 0.1–0.9)
MONO%: 10.5 % (ref 0.0–14.0)
NEUT%: 64.4 % (ref 38.4–76.8)
NEUTROS ABS: 5.2 10*3/uL (ref 1.5–6.5)
PLATELETS: 315 10*3/uL (ref 145–400)
RBC: 4.62 10*6/uL (ref 3.70–5.45)
RDW: 13 % (ref 11.2–14.5)
WBC: 8.1 10*3/uL (ref 3.9–10.3)
lymph#: 1.8 10*3/uL (ref 0.9–3.3)

## 2016-07-04 NOTE — Progress Notes (Signed)
Patient Care Team: Berkley Harvey, NP as PCP - General (Nurse Practitioner) Erroll Luna, MD as Consulting Physician (General Surgery) Nicholas Lose, MD as Consulting Physician (Hematology and Oncology) Kyung Rudd, MD as Consulting Physician (Radiation Oncology)  DIAGNOSIS:  Encounter Diagnosis  Name Primary?  . Malignant neoplasm of upper-outer quadrant of left breast in female, estrogen receptor negative ([place])     SUMMARY OF ONCOLOGIC HISTORY:   Breast cancer of upper-outer quadrant of left female breast ([place])   09/28/2000 Initial Biopsy    Left lumpectomy for stage I breast cancer that was ER/PR negative followed by adjuvant radiation      09/20/2015 Initial Diagnosis    Left breast mass 3 cm 1:00 position: IDC grade 2, ER/PR negative, Ki-67 60%, HER-2 negative, T2 N0 stage IIA clinical stage      10/28/2015 Surgery    Left mastectomy: IDC grade 3, 3 cm, lymphovascular invasion present, margins negative, 0/6 lymph nodes, ER 0%, PR 0%, HER-2 negative, Ki-67 60%, T2 N0 stage II a      11/25/2015 - 04/06/2016 Chemotherapy    Adjuvant chemotherapy with Dose dense Adriamycin and Cytoxan 4 followed by Taxol 12        CHIEF COMPLIANT: Follow-up after completing chemotherapy in February  INTERVAL HISTORY: Leah Mcdowell is a 61 year old with above-mentioned history of left breast cancer treated with mastectomy followed by adjuvant chemotherapy. She is here for routine follow-up. She is recovered fairly well from previous chemotherapy treatments. She is working full-time and does not have any complains of neuropathy. Energy levels have recovered.  REVIEW OF SYSTEMS:   Constitutional: Denies fevers, chills or abnormal weight loss Eyes: Denies blurriness of vision Ears, nose, mouth, throat, and face: Denies mucositis or sore throat Respiratory: Denies cough, dyspnea or wheezes Cardiovascular: Denies palpitation, chest discomfort Gastrointestinal:  Denies nausea, heartburn or  change in bowel habits Skin: Denies abnormal skin rashes Lymphatics: Denies new lymphadenopathy or easy bruising Neurological:Denies numbness, tingling or new weaknesses Behavioral/Psych: Mood is stable, no new changes  Extremities: No lower extremity edema Breast:  denies any pain or lumps or nodules in either breasts All other systems were reviewed with the patient and are negative.  I have reviewed the past medical history, past surgical history, social history and family history with the patient and they are unchanged from previous note.  ALLERGIES:  is allergic to no known allergies.  MEDICATIONS:  No current outpatient prescriptions on file.   No current facility-administered medications for this visit.     PHYSICAL EXAMINATION: ECOG PERFORMANCE STATUS: 1 - Symptomatic but completely ambulatory  Vitals:   07/04/16 1018  BP: 131/85  Pulse: (!) 103  Resp: 18  Temp: 97.8 F (36.6 C)   Filed Weights   07/04/16 1018  Weight: 99 lb (44.9 kg)    GENERAL:alert, no distress and comfortable SKIN: skin color, texture, turgor are normal, no rashes or significant lesions EYES: normal, Conjunctiva are pink and non-injected, sclera clear OROPHARYNX:no exudate, no erythema and lips, buccal mucosa, and tongue normal  NECK: supple, thyroid normal size, non-tender, without nodularity LYMPH:  no palpable lymphadenopathy in the cervical, axillary or inguinal LUNGS: clear to auscultation and percussion with normal breathing effort HEART: regular rate & rhythm and no murmurs and no lower extremity edema ABDOMEN:abdomen soft, non-tender and normal bowel sounds MUSCULOSKELETAL:no cyanosis of digits and no clubbing  NEURO: alert & oriented x 3 with fluent speech, no focal motor/sensory deficits EXTREMITIES: No lower extremity edema BREAST: No palpable  masses or nodules in either right or left breasts. No palpable axillary supraclavicular or infraclavicular adenopathy no breast tenderness  or nipple discharge. (exam performed in the presence of a chaperone)  LABORATORY DATA:  I have reviewed the data as listed   Chemistry      Component Value Date/Time   NA 137 04/06/2016 1029   K 4.3 04/06/2016 1029   CL 105 10/29/2015 0351   CO2 26 04/06/2016 1029   BUN 8.5 04/06/2016 1029   CREATININE 0.7 04/06/2016 1029      Component Value Date/Time   CALCIUM 9.7 04/06/2016 1029   ALKPHOS 88 04/06/2016 1029   AST 16 04/06/2016 1029   ALT 12 04/06/2016 1029   BILITOT 0.33 04/06/2016 1029       Lab Results  Component Value Date   WBC 8.1 07/04/2016   HGB 15.5 07/04/2016   HCT 45.1 07/04/2016   MCV 97.6 07/04/2016   PLT 315 07/04/2016   NEUTROABS 5.2 07/04/2016    ASSESSMENT & PLAN:  Breast cancer of upper-outer quadrant of left female breast ([place]) Left mastectomy 10/28/2015: IDC grade 3, 3 cm, lymphovascular invasion present, margins negative, 0/6 lymph nodes, ER 0%, PR 0%, HER-2 negative, Ki-67 60%, T2 N0 stage II a  Treatment plan: Adjuvant chemotherapy with dose dense Adriamycin and Cytoxan every 2 weeks 4 followed by weekly Taxol 12  completed 04/06/2016 No role of adjuvant radiation if the lymph nodes and margins are negative. No role for antiestrogen therapy since she is triple negative. ------------------------------------------------------------------------------------------------------------------------------------------------------------ Depression: OnEffexor. Long-term chemotherapy side effects: Patients had marked improvement in her case of appetite. Fatigue is also improved.  Breast cancer surveillance: Patient will need right-sided mammogram. I sent an order for mammogram to be done at [facility]   Survivorship: Discussed the importance of physical exercise in decreasing the likelihood of breast cancer recurrence. Recommended 30 mins daily 6 days a week of either brisk walking or cycling or swimming. Encouraged patient to eat more fruits and vegetables and  decrease red meat.   RTC in 6 months for follow up and after that we can see her once a year.  I spent 15 minutes talking to the patient of which more than half was spent in counseling and coordination of care.  Orders Placed This Encounter  Procedures  . MM DIAG BREAST TOMO UNI RIGHT    Standing Status:   Future    Standing Expiration Date:   09/03/2017    Order Specific Question:   Reason for Exam (SYMPTOM  OR DIAGNOSIS REQUIRED)    Answer:   Annual mammogram with h/o left mastectomy for breast cancer    Order Specific Question:   Preferred imaging location?    Answer:   External    Comments:   [facility]   The patient has a good understanding of the overall plan. she agrees with it. she will call with any problems that may develop before the next visit here.   Rulon Eisenmenger, MD 07/04/16

## 2016-07-04 NOTE — Assessment & Plan Note (Signed)
Left mastectomy 10/28/2015: IDC grade 3, 3 cm, lymphovascular invasion present, margins negative, 0/6 lymph nodes, ER 0%, PR 0%, HER-2 negative, Ki-67 60%, T2 N0 stage II a  Treatment plan: Adjuvant chemotherapy with dose dense Adriamycin and Cytoxan every 2 weeks 4 followed by weekly Taxol 12 completed 04/06/2016 No role of adjuvant radiation if the lymph nodes and margins are negative. No role for antiestrogen therapy since she is triple negative. ------------------------------------------------------------------------------------------------------------------------------------------------------------ Depression: OnEffexor. Long-term chemotherapy side effects: Patients had marked improvement in her case of appetite. Fatigue is also improved. RTC in 6 months for follow up.

## 2016-11-21 IMAGING — CR DG CHEST 1V PORT
1 series · 1 of 1 positions shown · non-contrast
Comparison: None.

CLINICAL DATA: Port-A-Cath insertion.

EXAM:
PORTABLE CHEST 1 VIEW

[AP]
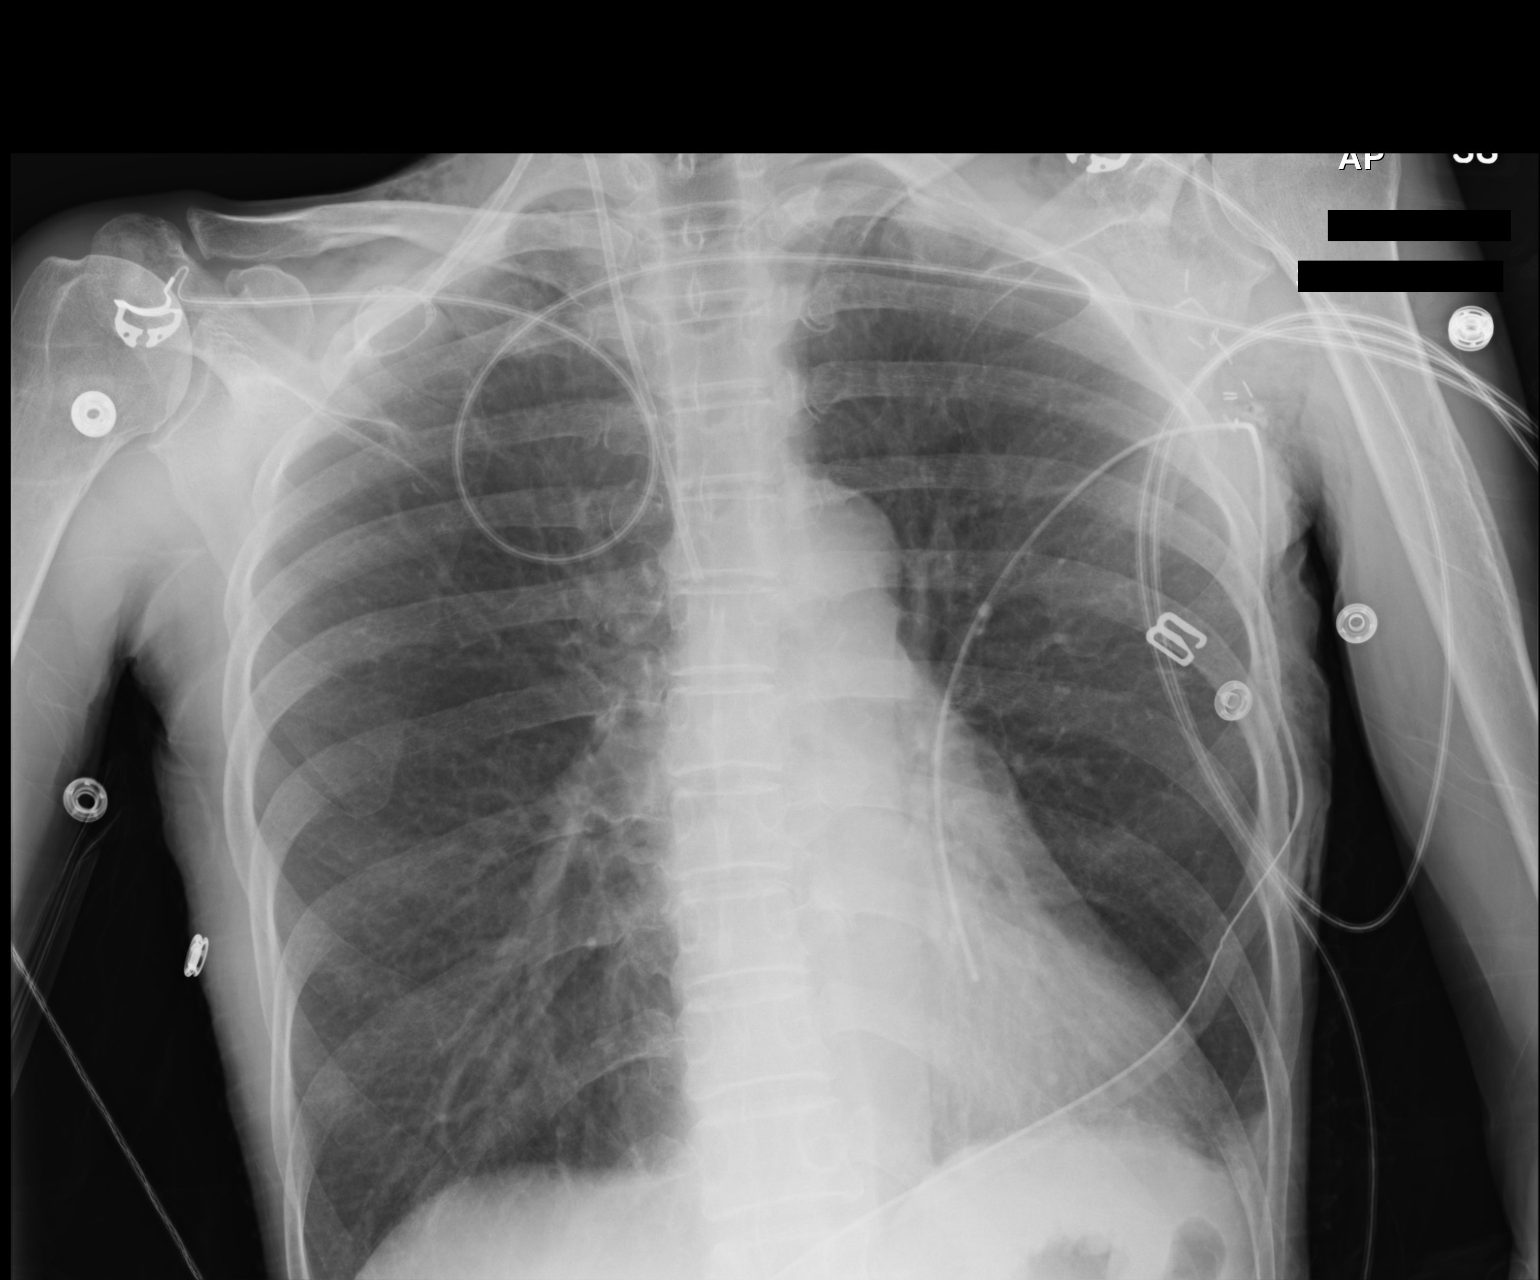

[1 of 1 positions shown; findings below may reference images not displayed]

FINDINGS: Right upper anterior chest Port-A-Cath extends into the right
internal jugular vein. Tip projects in the mid superior vena cava.

No pneumothorax.

There is a left chest tube that projects extending between the
lateral left third and fourth ribs. Tip projects over the left
cardiac silhouette. There is a small amount of subcutaneous air in
the supraclavicular regions bilaterally and along the left lateral
chest wall. Surgical vascular clips project over the left axilla.

Cardiac silhouette is normal in size. No mediastinal or hilar
masses. Minor reticular opacity at the left lung base is likely
atelectasis. Lungs are otherwise clear.
IMPRESSION: 1. Right-sided Port-A-Cath tip projects in the mid superior vena
cava.
2. No pneumothorax.
3. Left chest tube positioned as described. No acute findings in the
lungs.

## 2017-01-04 ENCOUNTER — Ambulatory Visit: Payer: BLUE CROSS/BLUE SHIELD | Admitting: Hematology and Oncology

## 2017-01-18 ENCOUNTER — Ambulatory Visit (HOSPITAL_BASED_OUTPATIENT_CLINIC_OR_DEPARTMENT_OTHER): Payer: BLUE CROSS/BLUE SHIELD | Admitting: Hematology and Oncology

## 2017-01-18 DIAGNOSIS — F329 Major depressive disorder, single episode, unspecified: Secondary | ICD-10-CM | POA: Diagnosis not present

## 2017-01-18 DIAGNOSIS — Z853 Personal history of malignant neoplasm of breast: Secondary | ICD-10-CM | POA: Diagnosis not present

## 2017-01-18 DIAGNOSIS — Z171 Estrogen receptor negative status [ER-]: Principal | ICD-10-CM

## 2017-01-18 DIAGNOSIS — C50412 Malignant neoplasm of upper-outer quadrant of left female breast: Secondary | ICD-10-CM

## 2017-01-18 NOTE — Progress Notes (Signed)
Patient Care Team: Berkley Harvey, NP as PCP - General (Nurse Practitioner) Erroll Luna, MD as Consulting Physician (General Surgery) Nicholas Lose, MD as Consulting Physician (Hematology and Oncology) Kyung Rudd, MD as Consulting Physician (Radiation Oncology)  DIAGNOSIS:  Encounter Diagnosis  Name Primary?  . Malignant neoplasm of upper-outer quadrant of left breast in female, estrogen receptor negative ([place])     SUMMARY OF ONCOLOGIC HISTORY:   Breast cancer of upper-outer quadrant of left female breast ([place])   09/28/2000 Initial Biopsy    Left lumpectomy for stage I breast cancer that was ER/PR negative followed by adjuvant radiation      09/20/2015 Initial Diagnosis    Left breast mass 3 cm 1:00 position: IDC grade 2, ER/PR negative, Ki-67 60%, HER-2 negative, T2 N0 stage IIA clinical stage      10/28/2015 Surgery    Left mastectomy: IDC grade 3, 3 cm, lymphovascular invasion present, margins negative, 0/6 lymph nodes, ER 0%, PR 0%, HER-2 negative, Ki-67 60%, T2 N0 stage II a      11/25/2015 - 04/06/2016 Chemotherapy    Adjuvant chemotherapy with Dose dense Adriamycin and Cytoxan 4 followed by Taxol 12        CHIEF COMPLIANT: Surveillance of breast cancer  INTERVAL HISTORY: DEJUANA WEIST is a 61 year old with above-mentioned history left breast cancer triple negative disease who had mastectomy followed by adjuvant chemotherapy and is currently on observation.  She reports no new problems or concerns.  She has recovered very well from the prior effects of chemotherapy.  REVIEW OF SYSTEMS:   Constitutional: Denies fevers, chills or abnormal weight loss Eyes: Denies blurriness of vision Ears, nose, mouth, throat, and face: Denies mucositis or sore throat Respiratory: Denies cough, dyspnea or wheezes Cardiovascular: Denies palpitation, chest discomfort Gastrointestinal:  Denies nausea, heartburn or change in bowel habits Skin: Denies abnormal skin rashes Lymphatics:  Denies new lymphadenopathy or easy bruising Neurological:Denies numbness, tingling or new weaknesses Behavioral/Psych: Mood is stable, no new changes  Extremities: No lower extremity edema Breast: Left mastectomy All other systems were reviewed with the patient and are negative.  I have reviewed the past medical history, past surgical history, social history and family history with the patient and they are unchanged from previous note.  ALLERGIES:  is allergic to no known allergies.  MEDICATIONS:  No current outpatient medications on file.   No current facility-administered medications for this visit.     PHYSICAL EXAMINATION: ECOG PERFORMANCE STATUS: 1 - Symptomatic but completely ambulatory  Vitals:   01/18/17 0944  BP: 136/88  Pulse: (!) 101  Resp: 18  Temp: (!) 97.5 F (36.4 C)  SpO2: 98%   There were no vitals filed for this visit.  GENERAL:alert, no distress and comfortable SKIN: skin color, texture, turgor are normal, no rashes or significant lesions EYES: normal, Conjunctiva are pink and non-injected, sclera clear OROPHARYNX:no exudate, no erythema and lips, buccal mucosa, and tongue normal  NECK: supple, thyroid normal size, non-tender, without nodularity LYMPH:  no palpable lymphadenopathy in the cervical, axillary or inguinal LUNGS: clear to auscultation and percussion with normal breathing effort HEART: regular rate & rhythm and no murmurs and no lower extremity edema ABDOMEN:abdomen soft, non-tender and normal bowel sounds MUSCULOSKELETAL:no cyanosis of digits and no clubbing  NEURO: alert & oriented x 3 with fluent speech, no focal motor/sensory deficits EXTREMITIES: No lower extremity edema BREAST: Left mastectomy no palpable lumps or nodules.  Right breast no lumps or nodules. (exam performed in the presence  of a chaperone)  LABORATORY DATA:  I have reviewed the data as listed   Chemistry      Component Value Date/Time   NA 138 07/04/2016 1007   K  4.7 07/04/2016 1007   CL 105 10/29/2015 0351   CO2 25 07/04/2016 1007   BUN 9.8 07/04/2016 1007   CREATININE 0.7 07/04/2016 1007      Component Value Date/Time   CALCIUM 9.8 07/04/2016 1007   ALKPHOS 111 07/04/2016 1007   AST 21 07/04/2016 1007   ALT 18 07/04/2016 1007   BILITOT 0.70 07/04/2016 1007       Lab Results  Component Value Date   WBC 8.1 07/04/2016   HGB 15.5 07/04/2016   HCT 45.1 07/04/2016   MCV 97.6 07/04/2016   PLT 315 07/04/2016   NEUTROABS 5.2 07/04/2016    ASSESSMENT & PLAN:  Breast cancer of upper-outer quadrant of left female breast ([place]) Left mastectomy 10/28/2015: IDC grade 3, 3 cm, lymphovascular invasion present, margins negative, 0/6 lymph nodes, ER 0%, PR 0%, HER-2 negative, Ki-67 60%, T2 N0 stage II a  Treatment plan: Adjuvant chemotherapy with dose dense Adriamycin and Cytoxan every 2 weeks 4 followed by weekly Taxol 12  completed 04/06/2016 No role of adjuvant radiation if the lymph nodes and margins are negative. No role for antiestrogen therapy since she is triple negative. ------------------------------------------------------------------------------------------------------------------------------------------------------------ Depression: On Effexor. Long-term chemotherapy side effects: Patients had marked improvement in her appetite. Fatigue has also improved.  Breast cancer surveillance:  Patient will need right-sided mammogram.  Breast exam 01/18/2017: Benign  Return to clinic in 1 year for follow-up  I spent 25 minutes talking to the patient of which more than half was spent in counseling and coordination of care.  No orders of the defined types were placed in this encounter.  The patient has a good understanding of the overall plan. she agrees with it. she will call with any problems that may develop before the next visit here.   Rulon Eisenmenger, MD 01/18/17

## 2017-01-18 NOTE — Assessment & Plan Note (Signed)
Left mastectomy 10/28/2015: IDC grade 3, 3 cm, lymphovascular invasion present, margins negative, 0/6 lymph nodes, ER 0%, PR 0%, HER-2 negative, Ki-67 60%, T2 N0 stage II a  Treatment plan: Adjuvant chemotherapy with dose dense Adriamycin and Cytoxan every 2 weeks 4 followed by weekly Taxol 12  completed 04/06/2016 No role of adjuvant radiation if the lymph nodes and margins are negative. No role for antiestrogen therapy since she is triple negative. ------------------------------------------------------------------------------------------------------------------------------------------------------------ Depression: On Effexor. Long-term chemotherapy side effects: Patients had marked improvement in her appetite. Fatigue has also improved.  Breast cancer surveillance:  Patient will need right-sided mammogram.  Breast exam 01/18/2017: Benign  Return to clinic in 1 year for follow-up

## 2017-01-20 ENCOUNTER — Telehealth: Payer: Self-pay | Admitting: Hematology and Oncology

## 2017-01-20 NOTE — Telephone Encounter (Signed)
Scheduled appt per 11/29 los - sent reminder letter in the mail.  

## 2017-04-05 ENCOUNTER — Other Ambulatory Visit: Payer: Self-pay | Admitting: Family Medicine

## 2017-04-05 DIAGNOSIS — M8580 Other specified disorders of bone density and structure, unspecified site: Secondary | ICD-10-CM

## 2017-05-08 ENCOUNTER — Encounter: Payer: Self-pay | Admitting: Family Medicine

## 2017-12-07 ENCOUNTER — Telehealth: Payer: Self-pay | Admitting: Hematology and Oncology

## 2017-12-07 NOTE — Telephone Encounter (Signed)
[facility] 11/27 - moved appointments to 11/25. Left message. Schedule mailed.

## 2018-01-03 ENCOUNTER — Telehealth: Payer: Self-pay | Admitting: Hematology and Oncology

## 2018-01-03 NOTE — Telephone Encounter (Signed)
Received the calendar that was mailed to patient back in the mail. Calendar was returned by patient asking that 11/25 appointments be cancelled and she would call back to reschedule at another time. Per patient she tried to call and was not able to get through - only voicemail.  Returned call to patient to confirm receipt of letter and appointment cancellation. Spoke with patient spouse and provided my direct number as means of calling back to reschedule when she is ready.

## 2018-01-14 ENCOUNTER — Ambulatory Visit: Payer: BLUE CROSS/BLUE SHIELD | Admitting: Hematology and Oncology

## 2018-01-18 ENCOUNTER — Ambulatory Visit: Payer: BLUE CROSS/BLUE SHIELD | Admitting: Hematology and Oncology
